# Patient Record
Sex: Female | Born: 1960 | Race: White | Hispanic: No | Smoking: Never smoker
Health system: Southern US, Community
[De-identification: ages and names within clinical notes are randomized; demographics above are authoritative.]

## PROBLEM LIST (undated history)

## (undated) DIAGNOSIS — R002 Palpitations: Secondary | ICD-10-CM

## (undated) DIAGNOSIS — N926 Irregular menstruation, unspecified: Secondary | ICD-10-CM

## (undated) DIAGNOSIS — I10 Essential (primary) hypertension: Secondary | ICD-10-CM

## (undated) DIAGNOSIS — E669 Obesity, unspecified: Secondary | ICD-10-CM

## (undated) HISTORY — DX: Irregular menstruation, unspecified: N92.6

## (undated) HISTORY — DX: Obesity, unspecified: E66.9

## (undated) HISTORY — PX: TUMOR REMOVAL: SHX12

## (undated) HISTORY — DX: Essential (primary) hypertension: I10

## (undated) HISTORY — PX: TONSILLECTOMY: SHX5217

## (undated) HISTORY — DX: Palpitations: R00.2

## (undated) HISTORY — PX: OTHER SURGICAL HISTORY: SHX169

## (undated) HISTORY — PX: ECTOPIC PREGNANCY SURGERY: SHX613

---

## 2002-07-24 ENCOUNTER — Other Ambulatory Visit: Admission: RE | Admit: 2002-07-24 | Discharge: 2002-07-24 | Payer: Self-pay | Admitting: Obstetrics and Gynecology

## 2003-07-29 ENCOUNTER — Other Ambulatory Visit: Admission: RE | Admit: 2003-07-29 | Discharge: 2003-07-29 | Payer: Self-pay | Admitting: Obstetrics and Gynecology

## 2004-10-22 ENCOUNTER — Ambulatory Visit: Payer: Self-pay | Admitting: Internal Medicine

## 2004-11-03 ENCOUNTER — Ambulatory Visit: Payer: Self-pay | Admitting: Internal Medicine

## 2004-11-05 ENCOUNTER — Other Ambulatory Visit: Admission: RE | Admit: 2004-11-05 | Discharge: 2004-11-05 | Payer: Self-pay | Admitting: Obstetrics and Gynecology

## 2004-12-17 ENCOUNTER — Ambulatory Visit (HOSPITAL_COMMUNITY): Admission: RE | Admit: 2004-12-17 | Discharge: 2004-12-17 | Payer: Self-pay | Admitting: Obstetrics and Gynecology

## 2007-05-24 ENCOUNTER — Other Ambulatory Visit: Admission: RE | Admit: 2007-05-24 | Discharge: 2007-05-24 | Payer: Self-pay | Admitting: Obstetrics and Gynecology

## 2008-05-26 ENCOUNTER — Other Ambulatory Visit: Admission: RE | Admit: 2008-05-26 | Discharge: 2008-05-26 | Payer: Self-pay | Admitting: Obstetrics and Gynecology

## 2009-02-26 ENCOUNTER — Emergency Department (HOSPITAL_COMMUNITY): Admission: EM | Admit: 2009-02-26 | Discharge: 2009-02-26 | Payer: Self-pay | Admitting: Emergency Medicine

## 2009-06-16 ENCOUNTER — Ambulatory Visit (HOSPITAL_COMMUNITY): Admission: RE | Admit: 2009-06-16 | Discharge: 2009-06-16 | Payer: Self-pay | Admitting: Obstetrics and Gynecology

## 2009-06-16 LAB — HM MAMMOGRAPHY: HM Mammogram: NORMAL

## 2009-11-09 ENCOUNTER — Emergency Department (HOSPITAL_COMMUNITY): Admission: EM | Admit: 2009-11-09 | Discharge: 2009-11-10 | Payer: Self-pay | Admitting: Emergency Medicine

## 2010-05-10 ENCOUNTER — Other Ambulatory Visit: Payer: Self-pay | Admitting: Obstetrics and Gynecology

## 2010-05-10 DIAGNOSIS — Z1231 Encounter for screening mammogram for malignant neoplasm of breast: Secondary | ICD-10-CM

## 2010-06-21 ENCOUNTER — Ambulatory Visit (HOSPITAL_COMMUNITY): Payer: 59 | Attending: Obstetrics and Gynecology

## 2011-07-07 LAB — HM PAP SMEAR: HM Pap smear: NEGATIVE

## 2012-07-10 ENCOUNTER — Encounter: Payer: Self-pay | Admitting: *Deleted

## 2012-07-13 ENCOUNTER — Ambulatory Visit: Payer: Self-pay | Admitting: Nurse Practitioner

## 2012-07-20 ENCOUNTER — Ambulatory Visit: Payer: Self-pay | Admitting: Nurse Practitioner

## 2012-07-31 ENCOUNTER — Encounter: Payer: Self-pay | Admitting: *Deleted

## 2012-08-13 ENCOUNTER — Encounter: Payer: Self-pay | Admitting: Nurse Practitioner

## 2012-08-13 ENCOUNTER — Ambulatory Visit (INDEPENDENT_AMBULATORY_CARE_PROVIDER_SITE_OTHER): Payer: 59 | Admitting: Nurse Practitioner

## 2012-08-13 ENCOUNTER — Other Ambulatory Visit: Payer: Self-pay | Admitting: Nurse Practitioner

## 2012-08-13 VITALS — BP 112/66 | HR 52 | Ht 65.25 in | Wt 237.0 lb

## 2012-08-13 DIAGNOSIS — Z1231 Encounter for screening mammogram for malignant neoplasm of breast: Secondary | ICD-10-CM

## 2012-08-13 DIAGNOSIS — Z01419 Encounter for gynecological examination (general) (routine) without abnormal findings: Secondary | ICD-10-CM

## 2012-08-13 NOTE — Progress Notes (Signed)
Patient ID: Alexandra Newman, female   DOB: 09-Feb-1961, 52 y.o.   MRN: [id number] 52 y.o. [id number] Married Caucasian Fe here for annual exam.  Menses every 6 weeks toward  the end of last year.  Then in 2014 - had cycle in Feb. May, and June.  The one in May was heavier and longer lasting 3 days. She has moved her mother from assisted living in [place] to assisted living here in [place] the past few months. Very mild vaso symptoms if any.  Patient's last menstrual period was 07/31/2012.          Sexually active: no  The current method of family planning is none.    Exercising: yes  Gym/ health club routine includes personal trainer 3 times per week. Smoker:  no  Health Maintenance: Pap:  07/07/11 normal with negative HR HPV MMG:  06/16/09 Scheduled for follow-up screening MMG at [facility] for 08/23/12 in office today. Colonoscopy:  2006, repeat in 10 years BMD:   never TDaP:   current Labs: 04/2012 @ PCP   reports that she has never smoked. She has never used smokeless tobacco. She reports that she does not drink alcohol or use illicit drugs.  Past Medical History  Diagnosis Date  . Irregular menses   . Hypertension   . Irregular bleeding     Past Surgical History  Procedure Laterality Date  . Ectopic pregnancy surgery    . 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 Tonsillectomy  52 yr old    Current Outpatient Prescriptions  Medication Sig Dispense Refill  . acetaminophen (TYLENOL) 325 MG tablet Take 650 mg by mouth every 6 (six) hours as needed for pain.      Marland Kitchen aspirin 81 MG tablet Take 81 mg by mouth daily.      Marland Kitchen atenolol (TENORMIN) 25 MG tablet Take 25 mg by mouth daily.       No current facility-administered medications for this visit.    Family History  Problem Relation Age of Onset  . Heart failure Brother   . Heart disease Mother     CABG  . Osteoarthritis Mother   . Hearing loss Mother   . Stroke Father     ROS:  Pertinent items are noted in HPI.  Otherwise,  a comprehensive ROS was negative.  Exam:   BP 112/66  Pulse 52  Ht 5' 5.25" (1.657 m)  Wt 237 lb (107.502 kg)  BMI 39.15 kg/m2  LMP 07/31/2012 Height: 5' 5.25" (165.7 cm) Weight Loss of 42 lbs. Last year at [facility]. This year added back 10 lbs. Ht Readings from Last 3 Encounters:  08/13/12 5' 5.25" (1.657 m)    General appearance: alert, cooperative and appears stated age Head: Normocephalic, without obvious abnormality, atraumatic Neck: no adenopathy, supple, symmetrical, trachea midline and thyroid normal to inspection and palpation Lungs: clear to auscultation bilaterally Breasts: normal appearance, no masses or tenderness Heart: regular rate and rhythm Abdomen: soft, non-tender; no masses,  no organomegaly Extremities: extremities normal, atraumatic, no cyanosis or edema Skin: Skin color, texture, turgor normal. No rashes or lesions Lymph nodes: Cervical, supraclavicular, and axillary nodes normal. No abnormal inguinal nodes palpated Neurologic: Grossly normal   Pelvic: External genitalia:  no lesions              Urethra:  normal appearing urethra with no masses, tenderness or lesions              Bartholin's and  Skene's: normal                 Vagina: normal appearing vagina with normal color and discharge, no lesions              Cervix: anteverted              Pap taken: no Bimanual Exam:  Uterus:  normal size, contour, position, consistency, mobility, non-tender              Adnexa: no mass, fullness, tenderness               Rectovaginal: Confirms               Anus:  normal sphincter tone, no lesions  A:  Well Woman with normal exam  Perimenopausal consistent with irregular cycles  P:   Pap smear as per guidelines   Keep menses calendar- if goes over 90 days without a cycle to cal  back  Mammogram given information and patient to schedule today, we went ahead and  made patient an appointment on July 3 at 9:30 am messageg left for pt.  counseled on breast self exam,  adequate intake of calcium and vitamin D,   diet and exercise, Kegel's exercises return annually or prn  An After Visit Summary was printed and given to the patient.

## 2012-08-13 NOTE — Patient Instructions (Addendum)

## 2012-08-15 NOTE — Progress Notes (Signed)
Encounter reviewed by Dr. Marylee Belzer Silva.  

## 2012-08-23 ENCOUNTER — Ambulatory Visit (HOSPITAL_COMMUNITY): Payer: 59 | Attending: Nurse Practitioner

## 2012-09-19 ENCOUNTER — Ambulatory Visit (HOSPITAL_COMMUNITY): Payer: 59 | Attending: Nurse Practitioner

## 2012-12-25 ENCOUNTER — Ambulatory Visit (HOSPITAL_COMMUNITY)
Admission: RE | Admit: 2012-12-25 | Discharge: 2012-12-25 | Disposition: A | Discharge status: Home / Self Care | Payer: 59 | Source: Ambulatory Visit | Attending: Nurse Practitioner | Admitting: Nurse Practitioner

## 2012-12-25 DIAGNOSIS — Z1231 Encounter for screening mammogram for malignant neoplasm of breast: Secondary | ICD-10-CM | POA: Insufficient documentation

## 2012-12-27 ENCOUNTER — Other Ambulatory Visit: Payer: Self-pay | Admitting: Nurse Practitioner

## 2012-12-27 DIAGNOSIS — R928 Other abnormal and inconclusive findings on diagnostic imaging of breast: Secondary | ICD-10-CM

## 2013-01-16 ENCOUNTER — Ambulatory Visit
Admission: RE | Admit: 2013-01-16 | Discharge: 2013-01-16 | Disposition: A | Discharge status: Home / Self Care | Payer: 59 | Source: Ambulatory Visit | Attending: Nurse Practitioner | Admitting: Nurse Practitioner

## 2013-01-16 DIAGNOSIS — R928 Other abnormal and inconclusive findings on diagnostic imaging of breast: Secondary | ICD-10-CM

## 2013-02-04 ENCOUNTER — Encounter: Payer: Self-pay | Admitting: Internal Medicine

## 2013-05-28 ENCOUNTER — Encounter: Payer: Self-pay | Admitting: Nurse Practitioner

## 2013-06-06 ENCOUNTER — Encounter: Payer: Self-pay | Admitting: Physician Assistant

## 2013-06-11 ENCOUNTER — Encounter: Payer: Self-pay | Admitting: Physician Assistant

## 2013-06-11 ENCOUNTER — Ambulatory Visit (INDEPENDENT_AMBULATORY_CARE_PROVIDER_SITE_OTHER): Payer: 59 | Admitting: Physician Assistant

## 2013-06-11 VITALS — BP 120/76 | HR 53 | Ht 63.0 in | Wt 259.0 lb

## 2013-06-11 DIAGNOSIS — I10 Essential (primary) hypertension: Secondary | ICD-10-CM

## 2013-06-11 DIAGNOSIS — K921 Melena: Secondary | ICD-10-CM

## 2013-06-11 MED ORDER — MOVIPREP 100 G PO SOLR: 1.0000 | ORAL | Status: DC

## 2013-06-11 NOTE — Progress Notes (Signed)
   Subjective:    Patient ID: Alexandra Newman, female    DOB: 11/19/1960, 53 y.o.   MRN: [id number]  HPI  Alexandra Newman is a 53 year old white female known to Dr. Marina GoodellPerry from prior colonoscopy done in 2006 for complaints of hematochezia. This was a normal exam. Today she is referred back by her PCP Dr. Timothy Lassousso after having a positive Hemosure  at the time of recent physical.. Labs are reviewed and hemoglobin 13.9 hematocrit 41.9 MCV of 86.7 on 05/28/2013. Patient is asymptomatic. She specifically denies any heartburn indigestion dysphagia or odynophagia. She has no complaints of abdominal pain changes in bowel habits melena or hematochezia. She takes a baby aspirin once daily no regular NSAIDs. Family history is negative for colon cancer as far she is aware. She has a maternal grandfather who had some sort of "stomach cancer".    Review of Systems  Constitutional: Negative.   HENT: Negative.   Eyes: Negative.   Respiratory: Negative.   Cardiovascular: Negative.   Gastrointestinal: Negative.   Endocrine: Negative.   Genitourinary: Negative.   Musculoskeletal: Negative.   Allergic/Immunologic: Negative.   Neurological: Negative.   Hematological: Negative.   Psychiatric/Behavioral: Negative.    Outpatient Prescriptions Prior to Visit  Medication Sig Dispense Refill  . acetaminophen (TYLENOL) 325 MG tablet Take 650 mg by mouth every 6 (six) hours as needed for pain.      Marland Kitchen. aspirin 81 MG tablet Take 81 mg by mouth daily.      Marland Kitchen. atenolol (TENORMIN) 25 MG tablet Take 25 mg by mouth daily.       No facility-administered medications prior to visit.   No Known Allergies        History  Substance Use Topics  . Smoking status: Never Smoker   . Smokeless tobacco: Never Used  . Alcohol Use: No   family history includes Hearing loss in her mother; Heart disease in her mother; Heart failure in her brother; Osteoarthritis in her mother; Stroke in her father.  Objective:   Physical Exam  female in no  acute distress, pleasant. Blood pressure 120/76 pulse 53 height 5 foot 3 weight 259 BMI 45. HEENT; nontraumatic normocephalic EOMI PERRLA sclera anicteric, Supple; no JVD, Cardiovascular; regular rate and rhythm with S1-S2 no murmur or gallop, Pulmonary; clear bilaterally, Abdomen;soft ,nontender ,nondistended, bowel sounds are active there is no palpable mass or hepatosplenomegaly, Rectal; exam not done patient recently with documented Hemoccult positive Hemosure . Ext; no clubbing ,cyanosis, or edema skin warm and dry, Psych; mood and affect appropriate        Assessment & Plan:   #141 53 year old female with Hemoccult-positive stool, asymptomatic. Average risk for colon neoplasia and negative colonoscopy in 2006. Will rule out occult colon lesion #2 morbid obesity BMI 45 #3 hypertension  Plan; Schedule for colonoscopy with Dr. Yancey FlemingsJohn Perry. Procedure discussed in detail with patient and she is agreeable to proceed.

## 2013-06-11 NOTE — Patient Instructions (Signed)
We sent a prescription for the colonoscopy prep to [facility] [address]  You have been scheduled for a colonoscopy with propofol. Please follow written instructions given to you at your visit today.  Please pick up your prep kit at the pharmacy within the next 1-3 days. If you use inhalers (even only as needed), please bring them with you on the day of your procedure.

## 2013-06-11 NOTE — Progress Notes (Signed)
Agree with initial assessment and plans as outlined 

## 2013-06-20 ENCOUNTER — Encounter: Payer: Self-pay | Admitting: Internal Medicine

## 2013-07-18 ENCOUNTER — Encounter: Payer: Self-pay | Admitting: Internal Medicine

## 2013-07-18 ENCOUNTER — Ambulatory Visit (AMBULATORY_SURGERY_CENTER): Payer: 59 | Admitting: Internal Medicine

## 2013-07-18 VITALS — BP 112/59 | HR 50 | Temp 97.3°F | Resp 19 | Ht 63.0 in | Wt 259.0 lb

## 2013-07-18 DIAGNOSIS — K921 Melena: Secondary | ICD-10-CM

## 2013-07-18 MED ORDER — SODIUM CHLORIDE 0.9 % IV SOLN: 500.0000 mL | INTRAVENOUS | Status: DC

## 2013-07-18 NOTE — Progress Notes (Signed)
A/ox3 pleased with MAC, report to Jane RN 

## 2013-07-18 NOTE — Patient Instructions (Signed)
YOU HAD AN ENDOSCOPIC PROCEDURE TODAY AT THE [facility]: Refer to the procedure report that was given to you for any specific questions about what was found during the examination.  If the procedure report does not answer your questions, please call your gastroenterologist to clarify.  If you requested that your care partner not be given the details of your procedure findings, then the procedure report has been included in a sealed envelope for you to review at your convenience later.  YOU SHOULD EXPECT: Some feelings of bloating in the abdomen. Passage of more gas than usual.  Walking can help get rid of the air that was put into your GI tract during the procedure and reduce the bloating. If you had a lower endoscopy (such as a colonoscopy or flexible sigmoidoscopy) you may notice spotting of blood in your stool or on the toilet paper. If you underwent a bowel prep for your procedure, then you may not have a normal bowel movement for a few days.  DIET: Your first meal following the procedure should be a light meal and then it is ok to progress to your normal diet.  A half-sandwich or bowl of soup is an example of a good first meal.  Heavy or fried foods are harder to digest and may make you feel nauseous or bloated.  Likewise meals heavy in dairy and vegetables can cause extra gas to form and this can also increase the bloating.  Drink plenty of fluids but you should avoid alcoholic beverages for 24 hours.  ACTIVITY: Your care partner should take you home directly after the procedure.  You should plan to take it easy, moving slowly for the rest of the day.  You can resume normal activity the day after the procedure however you should NOT DRIVE or use heavy machinery for 24 hours (because of the sedation medicines used during the test).    SYMPTOMS TO REPORT IMMEDIATELY: A gastroenterologist can be reached at any hour.  During normal business hours, 8:30 AM to 5:00 PM Monday through Friday,  call [phone].  After hours and on weekends, please call the GI answering service at [phone] who will take a message and have the physician on call contact you.   Following lower endoscopy (colonoscopy or flexible sigmoidoscopy):  Excessive amounts of blood in the stool  Significant tenderness or worsening of abdominal pains  Swelling of the abdomen that is new, acute  Fever of 100F or higher   FOLLOW UP: If any biopsies were taken you will be contacted by phone or by letter within the next 1-3 weeks.  Call your gastroenterologist if you have not heard about the biopsies in 3 weeks.  Our staff will call the home number listed on your records the next business day following your procedure to check on you and address any questions or concerns that you may have at that time regarding the information given to you following your procedure. This is a courtesy call and so if there is no answer at the home number and we have not heard from you through the emergency physician on call, we will assume that you have returned to your regular daily activities without incident.  SIGNATURES/CONFIDENTIALITY: You and/or your care partner have signed paperwork which will be entered into your electronic medical record.  These signatures attest to the fact that that the information above on your After Visit Summary has been reviewed and is understood.  Full responsibility of the confidentiality of   this discharge information lies with you and/or your care-partner.  Normal colonoscopy.  Next colonoscopy 10 years-2025. 

## 2013-07-18 NOTE — Op Note (Signed)
[facility] [address] [place], [address]   COLONOSCOPY PROCEDURE REPORT  PATIENT: Alexandra Newman, Alexandra Newman  MR#: [id number] BIRTHDATE: 1960/04/19 , 53  yrs. old GENDER: Female ENDOSCOPIST: Roxy Cedar, MD REFERRED SK:AJGO Timothy Lasso, M.D. PROCEDURE DATE:  07/18/2013 PROCEDURE:   Colonoscopy, diagnostic First Screening Colonoscopy - Avg.  risk and is 50 yrs.  old or older - No.  Prior Negative Screening - Now for repeat screening. N/A  History of Adenoma - Now for follow-up colonoscopy & has been > or = to 3 yrs.  N/A  Polyps Removed Today? No.  Recommend repeat exam, <10 yrs? No. ASA CLASS:   Class II INDICATIONS:heme-positive stool.   Normal exam 10-2004 MEDICATIONS: MAC sedation, administered by CRNA and propofol (Diprivan) 280mg  IV  DESCRIPTION OF PROCEDURE:   After the risks benefits and alternatives of the procedure were thoroughly explained, informed consent was obtained.  A digital rectal exam revealed no abnormalities of the rectum.   The LB [id number]  endoscope was introduced through the anus and advanced to the cecum, which was identified by both the appendix and ileocecal valve. No adverse events experienced.   The quality of the prep was excellent, using MoviPrep  The instrument was then slowly withdrawn as the colon was fully examined.      COLON FINDINGS: A normal appearing cecum, ileocecal valve, and appendiceal orifice were identified.  The ascending, hepatic flexure, transverse, splenic flexure, descending, sigmoid colon and rectum appeared unremarkable.  No polyps or cancers were seen. Retroflexed views revealed no abnormalities. The time to cecum=4 minutes 32 seconds.  Withdrawal time=13 minutes 0 seconds.  The scope was withdrawn and the procedure completed. COMPLICATIONS: There were no complications.  ENDOSCOPIC IMPRESSION: Normal colon  RECOMMENDATIONS: Continue current colorectal screening recommendations for "routine risk"  patients with a repeat colonoscopy in 10 years.   eSigned:  Roxy Cedar, MD 07/18/2013 9:07 AM   cc: Creola Corn, MD and The Patient

## 2013-07-19 ENCOUNTER — Telehealth: Payer: Self-pay | Admitting: *Deleted

## 2013-07-19 NOTE — Telephone Encounter (Signed)
  Follow up Call-  Call back number 07/18/2013  Post procedure Call Back phone  # [phone]  Permission to leave phone message Yes     Patient questions:  Do you have a fever, pain , or abdominal swelling? no Pain Score  0 *  Have you tolerated food without any problems? yes  Have you been able to return to your normal activities? yes  Do you have any questions about your discharge instructions: Diet   no Medications  no Follow up visit  no  Do you have questions or concerns about your Care? no  Actions: * If pain score is 4 or above: No action needed, pain <4.

## 2013-08-15 ENCOUNTER — Ambulatory Visit: Payer: 59 | Admitting: Nurse Practitioner

## 2013-08-26 ENCOUNTER — Ambulatory Visit (INDEPENDENT_AMBULATORY_CARE_PROVIDER_SITE_OTHER): Payer: 59 | Admitting: Nurse Practitioner

## 2013-08-26 ENCOUNTER — Encounter: Payer: Self-pay | Admitting: Nurse Practitioner

## 2013-08-26 VITALS — BP 118/76 | HR 60 | Ht 65.5 in | Wt 253.0 lb

## 2013-08-26 DIAGNOSIS — N926 Irregular menstruation, unspecified: Secondary | ICD-10-CM

## 2013-08-26 DIAGNOSIS — I10 Essential (primary) hypertension: Secondary | ICD-10-CM

## 2013-08-26 DIAGNOSIS — Z01419 Encounter for gynecological examination (general) (routine) without abnormal findings: Secondary | ICD-10-CM

## 2013-08-26 NOTE — Patient Instructions (Addendum)

## 2013-08-26 NOTE — Progress Notes (Signed)
Patient ID: Alexandra Newman, female   DOB: 05/15/1960, 53 y.o.   MRN: [id number] 53 y.o. [id number] Married Caucasian Fe here for annual exam. No menses since December of last year.  Last year her cycles were irregular as well.  She may have had another cycle in November last year but uncertain.  Now this year with her mothers health declining and had to be in hospital and nursing home she has been under stress.  Mother passed on 04/02/13.  So far this year a menses in May and June that was short lasting for 1 -1/2 day. Felt like maybe going to have a cycle in the spring with lower pelvic cramps but no menses.  No vaso symptoms.   Patient's last menstrual period was 08/11/2013.          Sexually active: No.  The current method of family planning is abstinence.    Exercising: Yes.    Home exercise routine includes biking and walking. Smoker:  no  Health Maintenance: Pap:  07/07/11, WNL, neg HR HPV MMG:  01/16/13, Bi-Rads 1: negative  Colonoscopy:  06/2013, following pos hemoccult test, normal, repeat in 10 years TDaP:  Dr. Timothy Lassousso - [facility] Labs: 05/2013, normal at PCP   reports that she has never smoked. She has never used smokeless tobacco. She reports that she drinks about 1.8 ounces of alcohol per week. She reports that she does not use illicit drugs.  Past Medical History  Diagnosis Date  . Irregular menses   . Hypertension   . Irregular bleeding   . Obesity     Past Surgical History  Procedure Laterality Date  . Ectopic pregnancy surgery    . 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 Tonsillectomy  53 yr old  . Appendectomy      taken during above tumor removal    Current Outpatient Prescriptions  Medication Sig Dispense Refill  . acetaminophen (TYLENOL) 325 MG tablet Take 650 mg by mouth every 6 (six) hours as needed for pain.      Marland Kitchen. aspirin 81 MG tablet Take 81 mg by mouth daily.      Marland Kitchen. atenolol (TENORMIN) 25 MG tablet Take 25 mg by mouth daily.       No current facility-administered  medications for this visit.    Family History  Problem Relation Age of Onset  . Heart failure Brother   . Heart disease Mother     CABG  . Osteoarthritis Mother   . Hearing loss Mother   . Stroke Father     ROS:  Pertinent items are noted in HPI.  Otherwise, a comprehensive ROS was negative.  Exam:   BP 118/76  Pulse 60  Ht 5' 5.5" (1.664 m)  Wt 253 lb (114.76 kg)  BMI 41.45 kg/m2  LMP 08/11/2013 Height: 5' 5.5" (166.4 cm)  Ht Readings from Last 3 Encounters:  08/26/13 5' 5.5" (1.664 m)  07/18/13 5\' 3"  (1.6 m)  06/11/13 5\' 3"  (1.6 m)    General appearance: alert, cooperative and appears stated age Head: Normocephalic, without obvious abnormality, atraumatic Neck: no adenopathy, supple, symmetrical, trachea midline and thyroid normal to inspection and palpation Lungs: clear to auscultation bilaterally Breasts: normal appearance, no masses or tenderness Heart: regular rate and rhythm Abdomen: soft, non-tender; no masses,  no organomegaly Extremities: extremities normal, atraumatic, no cyanosis or edema Skin: Skin color, texture, turgor normal. No rashes or lesions Lymph nodes: Cervical, supraclavicular, and axillary nodes normal. No abnormal  inguinal nodes palpated Neurologic: Grossly normal   Pelvic: External genitalia:  no lesions              Urethra:  normal appearing urethra with no masses, tenderness or lesions              Bartholin's and Skene's: normal                 Vagina: normal appearing vagina with normal color and discharge, no lesions              Cervix: anteverted              Pap taken: No. Bimanual Exam:  Uterus:  normal size, contour, position, consistency, mobility, non-tender              Adnexa: no mass, fullness, tenderness               Rectovaginal: Confirms               Anus:  normal sphincter tone, no lesions  A:  Well Woman with normal exam  Perimenopausal consistent with irregular menses  HTN  Obesity   P:   Reviewed health and  wellness pertinent to exam  Pap smear not taken today  Mammogram is due 12/2013  Will call back if no menses in September for a Provera challenge  Counseled on breast self exam, mammography screening, adequate intake of calcium and vitamin D, diet and exercise, Kegel's exercises return annually or prn  An After Visit Summary was printed and given to the patient.

## 2013-08-28 NOTE — Progress Notes (Signed)
Encounter reviewed by Dr. Brook Silva.  

## 2013-12-23 ENCOUNTER — Encounter: Payer: Self-pay | Admitting: Nurse Practitioner

## 2014-05-16 ENCOUNTER — Other Ambulatory Visit (HOSPITAL_COMMUNITY): Payer: Self-pay | Admitting: Internal Medicine

## 2014-05-16 DIAGNOSIS — Z1231 Encounter for screening mammogram for malignant neoplasm of breast: Secondary | ICD-10-CM

## 2014-05-19 ENCOUNTER — Ambulatory Visit (HOSPITAL_COMMUNITY)
Admission: RE | Admit: 2014-05-19 | Discharge: 2014-05-19 | Disposition: A | Discharge status: Home / Self Care | Payer: BLUE CROSS/BLUE SHIELD | Source: Ambulatory Visit | Attending: Internal Medicine | Admitting: Internal Medicine

## 2014-05-19 DIAGNOSIS — Z1231 Encounter for screening mammogram for malignant neoplasm of breast: Secondary | ICD-10-CM | POA: Diagnosis not present

## 2014-09-01 ENCOUNTER — Ambulatory Visit (INDEPENDENT_AMBULATORY_CARE_PROVIDER_SITE_OTHER): Payer: BLUE CROSS/BLUE SHIELD | Admitting: Nurse Practitioner

## 2014-09-01 ENCOUNTER — Encounter: Payer: Self-pay | Admitting: Nurse Practitioner

## 2014-09-01 VITALS — BP 130/84 | HR 52 | Ht 65.75 in | Wt 252.0 lb

## 2014-09-01 DIAGNOSIS — Z0000 Encounter for general adult medical examination without abnormal findings: Secondary | ICD-10-CM

## 2014-09-01 DIAGNOSIS — Z01419 Encounter for gynecological examination (general) (routine) without abnormal findings: Secondary | ICD-10-CM

## 2014-09-01 MED ORDER — MEDROXYPROGESTERONE ACETATE 10 MG PO TABS: 10.0000 mg | ORAL_TABLET | Freq: Every day | ORAL | Status: DC

## 2014-09-01 NOTE — Progress Notes (Signed)
Patient ID: Alexandra Newman, female   DO: 1960-08-16, 54 y.o.   MRM: [id number] 54 y.o. J.P. Married  Caucasian Fe here for annual exam.  No new health problems.  Feels well.  Patient's last menstrual period was 08/21/2013 (approximate).          Sexually active: No.  The current method of family planning is abstinence.    Exercising: No.  The patient does not participate in regular exercise at present. Smoker:  no  Health Maintenance: Pap:  07/07/11, negative with neg HR HPV MMG:  05/19/14, Bi-Rads 1:  Negative Colonoscopy:  06/2013, following pos hemoccult test, normal, repeat in 10 years DAP:  [facility], Dr. Timothy Lasso Labs:  PCP   reports that she has never smoked. She has never used smokeless tobacco. She reports that she drinks about 1.8 oz of alcohol per week. She reports that she does not use illicit drugs.  Past Medical History  Diagnosis Date  . Irregular menses   . Hypertension   . Irregular bleeding   . Obesity     Past Surgical History  Procedure Laterality Date  . Ectopic pregnancy surgery    . 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 Tonsillectomy  54 yr old  . Appendectomy      taken during above tumor removal    Current Outpatient Prescriptions  Medication Sig Dispense Refill  . acetaminophen (TYLENOL) 325 MG tablet Take 650 mg by mouth every 6 (six) hours as needed for pain.    Marland Kitchen aspirin 81 MG tablet Take 81 mg by mouth daily.    Marland Kitchen atenolol (TENORMIN) 25 MG tablet Take 25 mg by mouth daily.    . medroxyPROGESTERone (PROVERA) 10 MG tablet Take 1 tablet (10 mg total) by mouth daily. 10 tablet 0   No current facility-administered medications for this visit.    Family History  Problem Relation Age of Onset  . Heart failure Brother   . Heart disease Mother     CABG  . Osteoarthritis Mother   . Hearing loss Mother   . Stroke Father     ROS:  Pertinent items are noted in HPI.  Otherwise, a comprehensive ROS was negative.  Exam:   BP 130/84 mmHg  Pulse 52  Ht 5'  5.75" (1.67 m)  Wt 252 lb (114.306 kg)  BMI 40.99 kg/m2  LMP 08/21/2013 (Approximate) Height: 5' 5.75" (167 cm) Ht Readings from Last 3 Encounters:  09/01/14 5' 5.75" (1.67 m)  08/26/13 5' 5.5" (1.664 m)  07/18/13  (1.6 m)    General appearance: alert, cooperative and appears stated age Head: Normocephalic, without obvious abnormality, atraumatic Neck: no adenopathy, supple, symmetrical, trachea midline and thyroid normal to inspection and palpation Lungs: clear to auscultation bilaterally Breasts: normal appearance, no masses or tenderness Heart: regular rate and rhythm Abdomen: soft, non-tender; no masses,  no organomegaly Extremities: extremities normal, atraumatic, no cyanosis or edema Skin: Skin color, texture, turgor normal. No rashes or lesions Lymph nodes: Cervical, supraclavicular, and axillary nodes normal. No abnormal inguinal nodes palpated Neurologic: Grossly normal   Pelvic: External genitalia:  no lesions              Urethra:  normal appearing urethra with no masses, tenderness or lesions              Bartholin's and Skene's: normal                 Vagina: normal appearing vagina with  normal color and discharge, no lesions              Cervix: anteverted              Pap taken: Yes.   Bimanual Exam:  Uterus:  normal size, contour, position, consistency, mobility, non-tender              Adnexa: no mass, fullness, tenderness               Rectovaginal: Confirms               Anus:  normal sphincter tone, no lesions  Chaperone present:  yes  A:  Well Woman with normal exam  Perimenopausal consistent with current amenorrhea   HTN  Obesity - BMI 40.98  CP:   Reviewed health and wellness pertinent to exam  Pap smear as above  Mammogram is due 04/2015  Will do a Provera challenge - at risk for endo hyperplasia secondary to BMI  She is aware to call back with results   Counseled on breast self exam, mammography screening, adequate intake of calcium and  vitamin D, diet and exercise return annually or prn  An After Visit Summary was printed and given to the patient.

## 2014-09-01 NOTE — Patient Instructions (Addendum)

## 2014-09-03 LAB — IPS PAP TEST WITH HPV

## 2014-09-04 NOTE — Progress Notes (Signed)
Encounter reviewed by Dr. Brook Amundson C. Silva.  

## 2015-05-05 ENCOUNTER — Other Ambulatory Visit: Payer: Self-pay

## 2015-05-05 DIAGNOSIS — Z1231 Encounter for screening mammogram for malignant neoplasm of breast: Secondary | ICD-10-CM

## 2015-05-27 ENCOUNTER — Ambulatory Visit
Admission: RE | Admit: 2015-05-27 | Discharge: 2015-05-27 | Disposition: A | Discharge status: Home / Self Care | Payer: Managed Care, Other (non HMO) | Source: Ambulatory Visit

## 2015-05-27 DIAGNOSIS — Z1231 Encounter for screening mammogram for malignant neoplasm of breast: Secondary | ICD-10-CM

## 2015-09-08 ENCOUNTER — Ambulatory Visit (INDEPENDENT_AMBULATORY_CARE_PROVIDER_SITE_OTHER): Payer: Managed Care, Other (non HMO) | Admitting: Nurse Practitioner

## 2015-09-08 ENCOUNTER — Encounter: Payer: Self-pay | Admitting: Nurse Practitioner

## 2015-09-08 VITALS — BP 110/62 | HR 60 | Ht 65.0 in | Wt 223.0 lb

## 2015-09-08 DIAGNOSIS — Z01419 Encounter for gynecological examination (general) (routine) without abnormal findings: Secondary | ICD-10-CM | POA: Diagnosis not present

## 2015-09-08 DIAGNOSIS — Z0000 Encounter for general adult medical examination without abnormal findings: Secondary | ICD-10-CM

## 2015-09-08 DIAGNOSIS — Z780 Asymptomatic menopausal state: Secondary | ICD-10-CM

## 2015-09-08 NOTE — Patient Instructions (Signed)

## 2015-09-08 NOTE — Progress Notes (Signed)
Patient ID: Alexandra Newman, female   DOB: 1960-10-27, 55 y.o.   MRN: [id number]  55 y.o. G33P0020 Married  Caucasian Fe here for annual exam.  No new medical problems. Very rare any vaso symptoms.   Provera challenge last July without any vaginal bleeding.   Intentional wt loss since April of 2017 of 30 lb with increase exercise and calorie reduction.    Her divorce is pending and to be final in October.  She is going next spring to [place], [place] and [place] with a girlfriend.  Patient's last menstrual period was 08/21/2013 (approximate).          Sexually active: No.  The current method of family planning is post menopausal status.    Exercising: Yes.    walking approx. one hour daily 6-7 days per week Smoker:  no  Health Maintenance: Pap:09/01/14, Negative with neg HR HPV, no history of abnormal pap MMG:05/27/15, Bi-Rads 1: Negative Colonoscopy: 07/18/13, following pos hemoccult test, normal, repeat in 10 years TDaP: TD, Dr. Timothy Lasso Hep C and HIV: done today Labs: Dr. Timothy Lasso takes care of all labs   reports that she has never smoked. She has never used smokeless tobacco. She reports that she drinks about 1.8 oz of alcohol per week. She reports that she does not use illicit drugs.  Past Medical History  Diagnosis Date  . Irregular menses   . Hypertension   . Irregular bleeding   . Obesity     Past Surgical History  Procedure Laterality Date  . Ectopic pregnancy surgery    . 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 Tonsillectomy  55 yr old  . Appendectomy      taken during above tumor removal    Current Outpatient Prescriptions  Medication Sig Dispense Refill  . acetaminophen (TYLENOL) 325 MG tablet Take 650 mg by mouth every 6 (six) hours as needed for pain.    Marland Kitchen aspirin 81 MG tablet Take 81 mg by mouth daily.    Marland Kitchen atenolol (TENORMIN) 25 MG tablet Take 25 mg by mouth daily.     No current facility-administered medications for this visit.    Family History  Problem  Relation Age of Onset  . Heart failure Brother   . Heart disease Mother     CABG  . Osteoarthritis Mother   . Hearing loss Mother   . Stroke Father     ROS:  Pertinent items are noted in HPI.  Otherwise, a comprehensive ROS was negative.  Exam:   BP 110/62 mmHg  Pulse 60  Ht  (1.651 m)  Wt 223 lb (101.152 kg)  BMI 37.11 kg/m2  LMP 08/21/2013 (Approximate) Height:  (165.1 cm) Ht Readings from Last 3 Encounters:  09/08/15  (1.651 m)  09/01/14 5' 5.75" (1.67 m)  08/26/13 5' 5.5" (1.664 m)    General appearance: alert, cooperative and appears stated age Head: Normocephalic, without obvious abnormality, atraumatic Neck: no adenopathy, supple, symmetrical, trachea midline and thyroid normal to inspection and palpation Lungs: clear to auscultation bilaterally Breasts: normal appearance, no masses or tenderness Heart: regular rate and rhythm Abdomen: soft, non-tender; no masses,  no organomegaly Extremities: extremities normal, atraumatic, no cyanosis or edema Skin: Skin color, texture, turgor normal. No rashes or lesions Lymph nodes: Cervical, supraclavicular, and axillary nodes normal. No abnormal inguinal nodes palpated Neurologic: Grossly normal   Pelvic: External genitalia:  no lesions, use a smaller speculum  Urethra:  normal appearing urethra with no masses, tenderness or lesions              Bartholin's and Skene's: normal                 Vagina: normal appearing vagina with normal color and discharge, no lesions              Cervix: anteverted              Pap taken: No. Bimanual Exam:  Uterus:  normal size, contour, position, consistency, mobility, non-tender              Adnexa: no mass, fullness, tenderness               Rectovaginal: Confirms               Anus:  normal sphincter tone, no lesions  Chaperone present: no  A:  Well Woman with normal exam  Postmenopausal consistent with current amenorrhea   HTN Obesity - down to BMI 37.11   P:   Reviewed health and wellness pertinent to exam  Pap smear not due, no history of abnormal  Mammogram is due 05/2016  Will follow with labs, check FSH  Counseled on breast self exam, mammography screening, adequate intake of calcium and vitamin D, diet and exercise return annually or prn  An After Visit Summary was printed and given to the patient.

## 2015-09-08 NOTE — Progress Notes (Signed)
Reviewed personally.  M. Suzanne Rydge Tex[place], MD.  

## 2015-09-09 LAB — VITAMIN D 25 HYDROXY (VIT D DEFICIENCY, FRACTURES): VIT D 25 HYDROXY: 33 ng/mL (ref 30–100)

## 2015-09-09 LAB — HIV ANTIBODY (ROUTINE TESTING W REFLEX): HIV: NONREACTIVE

## 2015-09-09 LAB — HEPATITIS C ANTIBODY: HCV AB: NEGATIVE

## 2015-09-09 LAB — FOLLICLE STIMULATING HORMONE: FSH: 103.3 m[IU]/mL

## 2016-07-12 ENCOUNTER — Other Ambulatory Visit: Payer: Self-pay | Admitting: Internal Medicine

## 2016-07-12 DIAGNOSIS — Z1231 Encounter for screening mammogram for malignant neoplasm of breast: Secondary | ICD-10-CM

## 2016-07-29 ENCOUNTER — Ambulatory Visit
Admission: RE | Admit: 2016-07-29 | Discharge: 2016-07-29 | Disposition: A | Discharge status: Home / Self Care | Payer: Managed Care, Other (non HMO) | Source: Ambulatory Visit | Attending: Internal Medicine | Admitting: Internal Medicine

## 2016-07-29 DIAGNOSIS — Z1231 Encounter for screening mammogram for malignant neoplasm of breast: Secondary | ICD-10-CM

## 2016-09-09 ENCOUNTER — Ambulatory Visit: Payer: Managed Care, Other (non HMO) | Admitting: Nurse Practitioner

## 2016-09-27 ENCOUNTER — Ambulatory Visit: Payer: Managed Care, Other (non HMO) | Admitting: Nurse Practitioner

## 2016-10-03 ENCOUNTER — Ambulatory Visit (INDEPENDENT_AMBULATORY_CARE_PROVIDER_SITE_OTHER): Payer: Managed Care, Other (non HMO) | Admitting: Obstetrics and Gynecology

## 2016-10-03 ENCOUNTER — Encounter: Payer: Self-pay | Admitting: Obstetrics and Gynecology

## 2016-10-03 VITALS — BP 102/62 | HR 60 | Resp 14 | Ht 65.25 in | Wt 145.0 lb

## 2016-10-03 DIAGNOSIS — Z01419 Encounter for gynecological examination (general) (routine) without abnormal findings: Secondary | ICD-10-CM

## 2016-10-03 DIAGNOSIS — N952 Postmenopausal atrophic vaginitis: Secondary | ICD-10-CM

## 2016-10-03 NOTE — Patient Instructions (Signed)

## 2016-10-03 NOTE — Progress Notes (Signed)
56 y.o. G2P0020 Divorced CaucasianF here for annual exam.  She has lost about 150 lbs, since April, 2017 she has lost 110 lbs. Got divorced last year, doing well.  No vaginal bleeding. Not sexually active.     Patient's last menstrual period was 08/21/2013 (approximate).          Sexually active: No.  The current method of family planning is post menopausal status.    Exercising: Yes.    walking, personal trainer Smoker:  no  Health Maintenance: Pap:  09/01/14, Negative with neg HR HPV History of abnormal Pap:  no MMG:  07/29/16 BIRADS 1 negative/density b Colonoscopy:  07/18/13, following pos hemoccult test, normal, repeat in 10 years BMD:  Done at Dr. Jonny RuizJohn Russo's office -- normal per patient TDaP:  Up to date with PCP per patient Gardasil: N/A   reports that she has never smoked. She has never used smokeless tobacco. She reports that she drinks about 1.8 oz of alcohol per week . She reports that she does not use drugs. She retired recently. She worked in [employer] for 30 years.   Past Medical History:  Diagnosis Date  . Hypertension   . Irregular bleeding   . Irregular menses   . Obesity     Past Surgical History:  Procedure Laterality Date  . appendectomy     taken during above tumor removal  . ECTOPIC PREGNANCY SURGERY    . TONSILLECTOMY  56 yr old  . TUMOR REMOVAL Right    from right ovary    Current Outpatient Prescriptions  Medication Sig Dispense Refill  . acetaminophen (TYLENOL) 325 MG tablet Take 650 mg by mouth every 6 (six) hours as needed for pain.    Marland Kitchen. aspirin 81 MG tablet Take 81 mg by mouth daily.    Marland Kitchen. atenolol (TENORMIN) 25 MG tablet Take 25 mg by mouth daily.     No current facility-administered medications for this visit.   She is on the atenolol for palpitations. She tried coming off of the medication and the palpitations returned. Probably doesn't need it for the HTN.   Family History  Problem Relation Age of Onset  . Heart failure Brother    . Heart disease Mother        CABG  . Osteoarthritis Mother   . Hearing loss Mother   . Stroke Father   Brother died at 3349, untreated HTN. Probably died of sudden MI.   Review of Systems  Constitutional: Negative.   HENT: Negative.   Eyes: Negative.   Respiratory: Negative.   Cardiovascular: Negative.   Gastrointestinal: Negative.   Endocrine: Negative.   Genitourinary: Negative.   Musculoskeletal: Negative.   Skin: Negative.   Allergic/Immunologic: Negative.   Neurological: Negative.   Hematological: Negative.   Psychiatric/Behavioral: Negative.     Exam:   BP 102/62 (BP Location: Right Arm, Patient Position: Sitting, Cuff Size: Normal)   Pulse 60   Resp 14   Ht 5' 5.25" (1.657 m)   Wt 145 lb (65.8 kg)   LMP 08/21/2013 (Approximate)   BMI 23.94 kg/m   Weight change: @WEIGHTCHANGE @ Height:   Height: 5' 5.25" (165.7 cm)  Ht Readings from Last 3 Encounters:  10/03/16 5' 5.25" (1.657 m)  09/08/15 5\' 5"  (1.651 m)  09/01/14 5' 5.75" (1.67 m)    General appearance: alert, cooperative and appears stated age Head: Normocephalic, without obvious abnormality, atraumatic Neck: no adenopathy, supple, symmetrical, trachea midline and thyroid normal to inspection and palpation Lungs:  clear to auscultation bilaterally Cardiovascular: regular rate and rhythm Breasts: normal appearance, no masses or tenderness Abdomen: soft, non-tender; bowel sounds normal; no masses,  no organomegaly Extremities: extremities normal, atraumatic, no cyanosis or edema Skin: Skin color, texture, turgor normal. No rashes or lesions. She has extra skin on her abdomen and legs from all of the weight loss. She has hair on her breasts bilaterally, she states it has always been like that no change.  Lymph nodes: Cervical, supraclavicular, and axillary nodes normal. No abnormal inguinal nodes palpated Neurologic: Grossly normal   Pelvic: External genitalia:  no lesions              Urethra:  normal  appearing urethra with no masses, tenderness or lesions              Bartholins and Skenes: normal                 Vagina: atrophic appearing vagina, needed a pediatric speculum and insertion of one finger vaginally was tight. Normal color, no discharge, no lesions              Cervix: no lesions               Bimanual Exam:  Uterus:  normal size, contour, position, consistency, mobility, non-tender              Adnexa: no mass, fullness, tenderness               Rectovaginal: Confirms               Anus:  normal sphincter tone, no lesions  Chaperone was present for exam.  A:  Well Woman with normal exam  Vaginal atrophy, not currently sexually active. I discussed with her she may need vaginal estrogen if she is going to be sexually active  P:   Pap next year  Mammogram and colonoscopy are UTD  She is on calcium and vit d supplement  Discussed breast self awareness  Labs with primary MD

## 2016-11-08 ENCOUNTER — Telehealth: Payer: Self-pay | Admitting: Obstetrics and Gynecology

## 2016-11-08 MED ORDER — ESTRADIOL 10 MCG VA TABS: 1.0000 | ORAL_TABLET | VAGINAL | 1 refills | Status: DC

## 2016-11-08 NOTE — Telephone Encounter (Signed)
Please let the patient know that I called in a one month supply with one refill. I would recommend she come in for f/u in one month. Please inform and set up an appointment.

## 2016-11-08 NOTE — Telephone Encounter (Signed)
Patient is interested in starting an estrogen medication. Patient said that she talked with Dr.Jertson about this at her last visit.

## 2016-11-08 NOTE — Telephone Encounter (Signed)
Spoke with patient. Patient was seen on 10/03/2016 for aex with Dr.Jertson. Reports she discussed use of vaginal estrogen for dryness and sexual activity. Would like to start on this medication at this time.  Routing to Dr.Jertson for review and advise of vaginal estrogen.

## 2016-11-09 NOTE — Telephone Encounter (Signed)
Spoke with patient. Advised of message as seen below from Dr.Jertson. Patient verbalizes understanding. 1 month follow up appointment scheduled for 12/08/2016 at 4 pm with Dr.Jertson. Patient is agreeable to date and time.  Routing to provider for final review. Patient agreeable to disposition. Will close encounter.

## 2016-12-08 ENCOUNTER — Encounter: Payer: Self-pay | Admitting: Obstetrics and Gynecology

## 2016-12-08 ENCOUNTER — Ambulatory Visit (INDEPENDENT_AMBULATORY_CARE_PROVIDER_SITE_OTHER): Payer: Managed Care, Other (non HMO) | Admitting: Obstetrics and Gynecology

## 2016-12-08 VITALS — BP 110/62 | HR 56 | Resp 14 | Ht 65.25 in | Wt 143.0 lb

## 2016-12-08 DIAGNOSIS — N952 Postmenopausal atrophic vaginitis: Secondary | ICD-10-CM | POA: Diagnosis not present

## 2016-12-08 MED ORDER — ESTRADIOL 10 MCG VA TABS: 1.0000 | ORAL_TABLET | VAGINAL | 3 refills | Status: DC

## 2016-12-08 NOTE — Progress Notes (Signed)
GYNECOLOGY  VISIT  CC:   Follow up vaginal estrogen  HPI: 56 y.o. G69P0020 Divorced Caucasian female here for follow up vaginal estrogen. The patient was noted to have a very atrophic vagina at her annual exam, difficult insertion on one finger. She decided to go on the vaginal estrogen for dryness and for the ability to be sexually active. Finished it last week. She dose feel less dry. She doesn't currently have a partner, but would like to.   GYNECOLOGIC HISTORY: Patient's last menstrual period was 08/21/2013 (approximate). Contraception:  Post menopausal  Menopausal hormone therapy: none  Patient Active Problem List   Diagnosis Date Noted  . Severe obesity (BMI >= 40) (HCC) 06/11/2013  . HTN (hypertension) 06/11/2013    Past Medical History:  Diagnosis Date  . Hypertension   . Irregular bleeding   . Irregular menses   . Obesity   . Palpitations     Past Surgical History:  Procedure Laterality Date  . appendectomy     taken during above tumor removal  . ECTOPIC PREGNANCY SURGERY    . TONSILLECTOMY  56 yr old  . TUMOR REMOVAL Right    from right ovary    MEDS:   Current Outpatient Prescriptions on File Prior to Visit  Medication Sig Dispense Refill  . acetaminophen (TYLENOL) 325 MG tablet Take 650 mg by mouth every 6 (six) hours as needed for pain.    Marland Kitchen aspirin 81 MG tablet Take 81 mg by mouth daily.    Marland Kitchen atenolol (TENORMIN) 25 MG tablet Take 25 mg by mouth daily.     No current facility-administered medications on file prior to visit.     ALLERGIES: Patient has no known allergies.  Family History  Problem Relation Age of Onset  . Heart failure Brother   . Heart disease Mother        CABG  . Osteoarthritis Mother   . Hearing loss Mother   . Stroke Father       Review of Systems  Constitutional: Negative.   HENT: Negative.   Eyes: Negative.   Respiratory: Negative.   Cardiovascular: Negative.   Gastrointestinal: Negative.   Genitourinary: Negative.    Musculoskeletal: Negative.   Skin: Negative.   Neurological: Negative.   Endo/Heme/Allergies: Negative.   Psychiatric/Behavioral: Negative.     PHYSICAL EXAMINATION:    BP 110/62 (BP Location: Right Arm, Patient Position: Sitting, Cuff Size: Normal)   Pulse (!) 56   Resp 14   Ht 5' 5.25" (1.657 m)   Wt 143 lb (64.9 kg)   LMP 08/21/2013 (Approximate)   BMI 23.61 kg/m     General appearance: alert, cooperative and appears stated age  Pelvic: External genitalia:  no lesions              Urethra:  normal appearing urethra with no masses, tenderness or lesions              Bartholins and Skenes: normal                 Vagina: normal appearing vagina with mild atrophy (improvement). Normal color and discharge, no lesions  Able to insert one finger and part of a second finger, marked improvement              Cervix: no lesions               Chaperone was present for exam.  Assessment: Vaginal atrophy, improving with vaginal estrogen  Plan: Continue estrogen 2  x a week Call with concerns F/U in 8/19 for an annual exam

## 2017-06-15 ENCOUNTER — Other Ambulatory Visit: Payer: Self-pay | Admitting: Internal Medicine

## 2017-06-15 DIAGNOSIS — Z1231 Encounter for screening mammogram for malignant neoplasm of breast: Secondary | ICD-10-CM

## 2017-07-31 ENCOUNTER — Ambulatory Visit
Admission: RE | Admit: 2017-07-31 | Discharge: 2017-07-31 | Disposition: A | Discharge status: Home / Self Care | Payer: Managed Care, Other (non HMO) | Source: Ambulatory Visit | Attending: Internal Medicine | Admitting: Internal Medicine

## 2017-07-31 DIAGNOSIS — Z1231 Encounter for screening mammogram for malignant neoplasm of breast: Secondary | ICD-10-CM

## 2017-10-05 NOTE — Progress Notes (Deleted)
57 y.o. [id number] DivorcedCaucasianF here for annual exam.      Patient's last menstrual period was 08/21/2013 (approximate).          Sexually active: {yes no:314532}  The current method of family planning is post menopausal status.    Exercising: {yes no:314532}  {types:19826} Smoker:  {YES [id number]:22349}  Health Maintenance: Pap:  09/01/2014 normal History of abnormal Pap:  no MMG:  07/31/2017 BI-RADS CATEGORY  1: Negative Colonoscopy:  07/18/2017 normal repeat in 10 years BMD:   n/a TDaP:  *** Gardasil: ***   reports that she has never smoked. She has never used smokeless tobacco. She reports that she drinks about 3.0 standard drinks of alcohol per week. She reports that she does not use drugs.  Past Medical History:  Diagnosis Date  . Hypertension   . Irregular bleeding   . Irregular menses   . Obesity   . Palpitations     Past Surgical History:  Procedure Laterality Date  . appendectomy     taken during above tumor removal  . ECTOPIC PREGNANCY SURGERY    . TONSILLECTOMY  57 yr old  . TUMOR REMOVAL Right    from right ovary    Current Outpatient Medications  Medication Sig Dispense Refill  . acetaminophen (TYLENOL) 325 MG tablet Take 650 mg by mouth every 6 (six) hours as needed for pain.    Marland Kitchen. aspirin 81 MG tablet Take 81 mg by mouth daily.    Marland Kitchen. atenolol (TENORMIN) 25 MG tablet Take 25 mg by mouth daily.    . Estradiol 10 MCG TABS vaginal tablet Place 1 tablet (10 mcg total) vaginally 2 (two) times a week. 24 tablet 3   No current facility-administered medications for this visit.     Family History  Problem Relation Age of Onset  . Heart failure Brother   . Heart disease Mother        CABG  . Osteoarthritis Mother   . Hearing loss Mother   . Stroke Father     Review of Systems  Constitutional: Negative.   HENT: Negative.   Eyes: Negative.   Respiratory: Negative.   Cardiovascular: Negative.   Gastrointestinal: Negative.   Endocrine: Negative.    Genitourinary: Negative.   Musculoskeletal: Negative.   Skin: Negative.   Allergic/Immunologic: Negative.   Neurological: Negative.   Hematological: Negative.   Psychiatric/Behavioral: Negative.   All other systems reviewed and are negative.   Exam:   LMP 08/21/2013 (Approximate)   Weight change: @WEIGHTCHANGE @ Height:      Ht Readings from Last 3 Encounters:  12/08/16 5' 5.25" (1.657 m)  10/03/16 5' 5.25" (1.657 m)  09/08/15 5\' 5"  (1.651 m)    General appearance: alert, cooperative and appears stated age Head: Normocephalic, without obvious abnormality, atraumatic Neck: no adenopathy, supple, symmetrical, trachea midline and thyroid {CHL AMB PHY EX THYROID NORM DEFAULT:[phone]::"normal to inspection and palpation"} Lungs: clear to auscultation bilaterally Cardiovascular: regular rate and rhythm Breasts: {Exam; breast:13139::"normal appearance, no masses or tenderness"} Abdomen: soft, non-tender; non distended,  no masses,  no organomegaly Extremities: extremities normal, atraumatic, no cyanosis or edema Skin: Skin color, texture, turgor normal. No rashes or lesions Lymph nodes: Cervical, supraclavicular, and axillary nodes normal. No abnormal inguinal nodes palpated Neurologic: Grossly normal   Pelvic: External genitalia:  no lesions              Urethra:  normal appearing urethra with no masses, tenderness or lesions  Bartholins and Skenes: normal                 Vagina: normal appearing vagina with normal color and discharge, no lesions              Cervix: {CHL AMB PHY EX CERVIX NORM [phone]::"no lesions"}               Bimanual Exam:  Uterus:  {CHL AMB PHY EX UTERUS NORM DEFAULT:[phone]::"normal size, contour, position, consistency, mobility, non-tender"}              Adnexa: {CHL AMB PHY EX ADNEXA NO MASS DEFAULT:[phone]::"no mass, fullness, tenderness"}               Rectovaginal: Confirms               Anus:  normal sphincter tone, no  lesions  Chaperone was present for exam.  A:  Well Woman with normal exam  P:

## 2017-10-11 ENCOUNTER — Ambulatory Visit: Payer: Managed Care, Other (non HMO) | Admitting: Obstetrics and Gynecology

## 2017-10-11 ENCOUNTER — Encounter: Payer: Self-pay | Admitting: Obstetrics and Gynecology

## 2017-10-28 DIAGNOSIS — Z23 Encounter for immunization: Secondary | ICD-10-CM | POA: Diagnosis not present

## 2017-11-30 DIAGNOSIS — I10 Essential (primary) hypertension: Secondary | ICD-10-CM | POA: Diagnosis not present

## 2017-11-30 DIAGNOSIS — Z6828 Body mass index (BMI) 28.0-28.9, adult: Secondary | ICD-10-CM | POA: Diagnosis not present

## 2017-11-30 DIAGNOSIS — M25562 Pain in left knee: Secondary | ICD-10-CM | POA: Diagnosis not present

## 2017-12-04 DIAGNOSIS — M25562 Pain in left knee: Secondary | ICD-10-CM | POA: Diagnosis not present

## 2018-02-20 DIAGNOSIS — L739 Follicular disorder, unspecified: Secondary | ICD-10-CM | POA: Diagnosis not present

## 2018-02-20 DIAGNOSIS — Z6829 Body mass index (BMI) 29.0-29.9, adult: Secondary | ICD-10-CM | POA: Diagnosis not present

## 2018-02-20 DIAGNOSIS — B999 Unspecified infectious disease: Secondary | ICD-10-CM | POA: Diagnosis not present

## 2018-07-27 DIAGNOSIS — R739 Hyperglycemia, unspecified: Secondary | ICD-10-CM | POA: Diagnosis not present

## 2018-07-27 DIAGNOSIS — R82998 Other abnormal findings in urine: Secondary | ICD-10-CM | POA: Diagnosis not present

## 2018-07-27 DIAGNOSIS — I10 Essential (primary) hypertension: Secondary | ICD-10-CM | POA: Diagnosis not present

## 2018-07-27 DIAGNOSIS — Z0000 Encounter for general adult medical examination without abnormal findings: Secondary | ICD-10-CM | POA: Diagnosis not present

## 2018-08-03 DIAGNOSIS — L988 Other specified disorders of the skin and subcutaneous tissue: Secondary | ICD-10-CM | POA: Diagnosis not present

## 2018-08-03 DIAGNOSIS — Z1331 Encounter for screening for depression: Secondary | ICD-10-CM | POA: Diagnosis not present

## 2018-08-03 DIAGNOSIS — Z0000 Encounter for general adult medical examination without abnormal findings: Secondary | ICD-10-CM | POA: Diagnosis not present

## 2018-08-03 DIAGNOSIS — H6980 Other specified disorders of Eustachian tube, unspecified ear: Secondary | ICD-10-CM | POA: Diagnosis not present

## 2018-08-03 DIAGNOSIS — M25562 Pain in left knee: Secondary | ICD-10-CM | POA: Diagnosis not present

## 2018-09-14 ENCOUNTER — Other Ambulatory Visit: Payer: Self-pay | Admitting: Internal Medicine

## 2018-09-14 DIAGNOSIS — Z1231 Encounter for screening mammogram for malignant neoplasm of breast: Secondary | ICD-10-CM

## 2018-11-01 ENCOUNTER — Ambulatory Visit
Admission: RE | Admit: 2018-11-01 | Discharge: 2018-11-01 | Disposition: A | Discharge status: Home / Self Care | Payer: BC Managed Care – PPO | Source: Ambulatory Visit | Attending: Internal Medicine | Admitting: Internal Medicine

## 2018-11-01 ENCOUNTER — Other Ambulatory Visit: Payer: Self-pay

## 2018-11-01 DIAGNOSIS — Z1231 Encounter for screening mammogram for malignant neoplasm of breast: Secondary | ICD-10-CM

## 2018-11-03 DIAGNOSIS — Z23 Encounter for immunization: Secondary | ICD-10-CM | POA: Diagnosis not present

## 2019-07-29 DIAGNOSIS — Z0000 Encounter for general adult medical examination without abnormal findings: Secondary | ICD-10-CM | POA: Diagnosis not present

## 2019-07-29 DIAGNOSIS — R739 Hyperglycemia, unspecified: Secondary | ICD-10-CM | POA: Diagnosis not present

## 2019-07-29 DIAGNOSIS — I10 Essential (primary) hypertension: Secondary | ICD-10-CM | POA: Diagnosis not present

## 2019-07-29 DIAGNOSIS — D649 Anemia, unspecified: Secondary | ICD-10-CM | POA: Diagnosis not present

## 2019-08-05 DIAGNOSIS — Z1331 Encounter for screening for depression: Secondary | ICD-10-CM | POA: Diagnosis not present

## 2019-08-05 DIAGNOSIS — H6980 Other specified disorders of Eustachian tube, unspecified ear: Secondary | ICD-10-CM | POA: Diagnosis not present

## 2019-08-05 DIAGNOSIS — D72819 Decreased white blood cell count, unspecified: Secondary | ICD-10-CM | POA: Diagnosis not present

## 2019-08-05 DIAGNOSIS — R251 Tremor, unspecified: Secondary | ICD-10-CM | POA: Diagnosis not present

## 2019-08-05 DIAGNOSIS — Z0000 Encounter for general adult medical examination without abnormal findings: Secondary | ICD-10-CM | POA: Diagnosis not present

## 2019-08-05 DIAGNOSIS — D649 Anemia, unspecified: Secondary | ICD-10-CM | POA: Diagnosis not present

## 2019-09-24 DIAGNOSIS — G252 Other specified forms of tremor: Secondary | ICD-10-CM | POA: Diagnosis not present

## 2019-10-15 DIAGNOSIS — G252 Other specified forms of tremor: Secondary | ICD-10-CM | POA: Diagnosis not present

## 2019-10-19 DIAGNOSIS — Z20822 Contact with and (suspected) exposure to covid-19: Secondary | ICD-10-CM | POA: Diagnosis not present

## 2019-10-19 DIAGNOSIS — Z03818 Encounter for observation for suspected exposure to other biological agents ruled out: Secondary | ICD-10-CM | POA: Diagnosis not present

## 2019-11-23 DIAGNOSIS — Z23 Encounter for immunization: Secondary | ICD-10-CM | POA: Diagnosis not present

## 2019-12-02 DIAGNOSIS — G20 Parkinson's disease: Secondary | ICD-10-CM | POA: Diagnosis not present

## 2020-03-01 IMAGING — MG MM DIGITAL SCREENING BILAT W/ TOMO W/ CAD
8 series · 8 of 24 positions shown · non-contrast
Comparison: Previous exam(s).

CLINICAL DATA: Screening.

EXAM:
DIGITAL SCREENING BILATERAL MAMMOGRAM WITH TOMO AND CAD

[L MLO synth-2D]
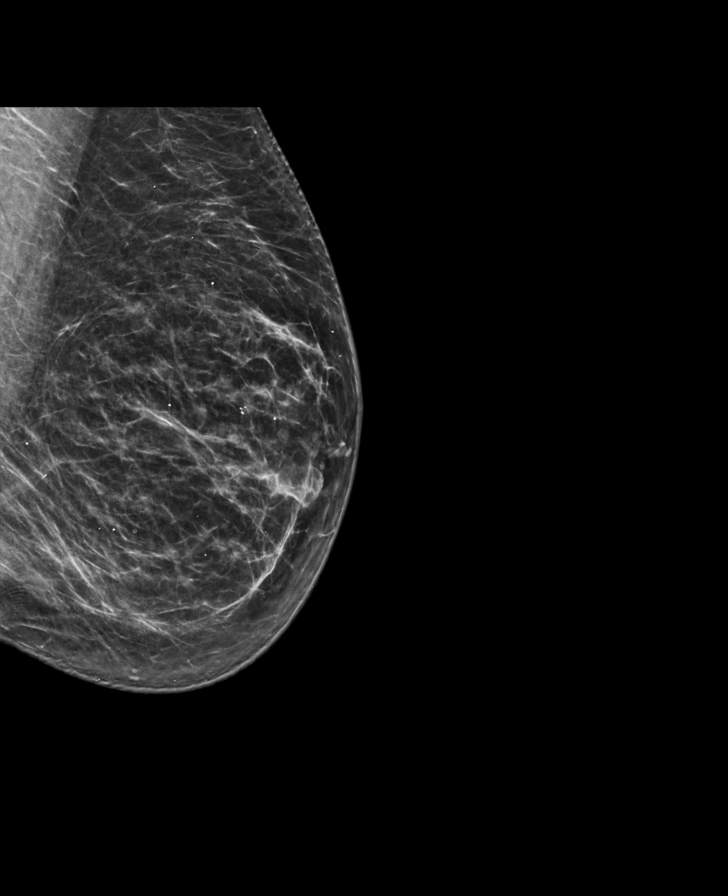

[L CC synth-2D]
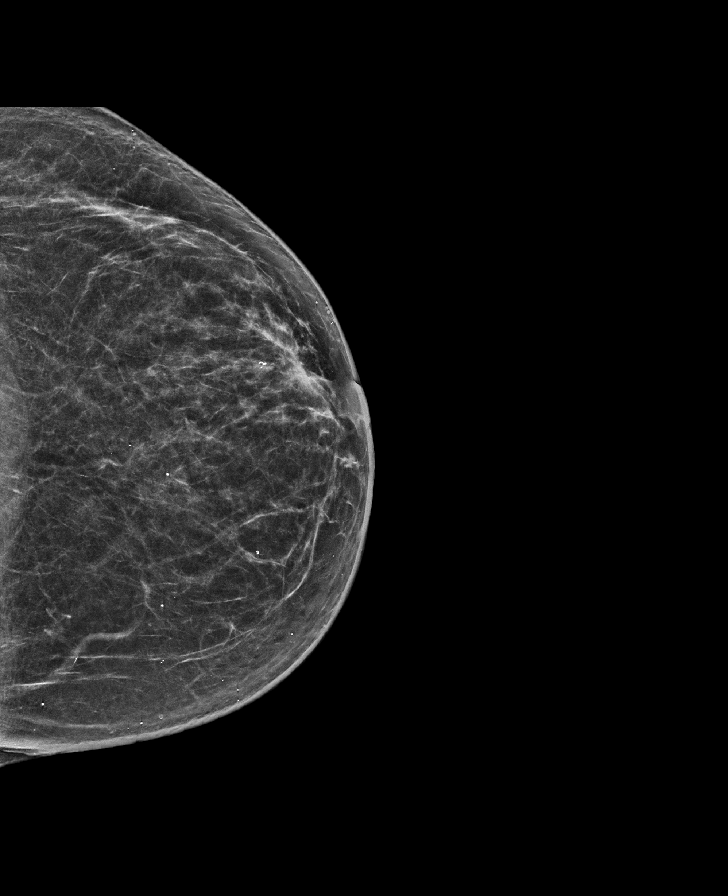

[R MLO synth-2D]
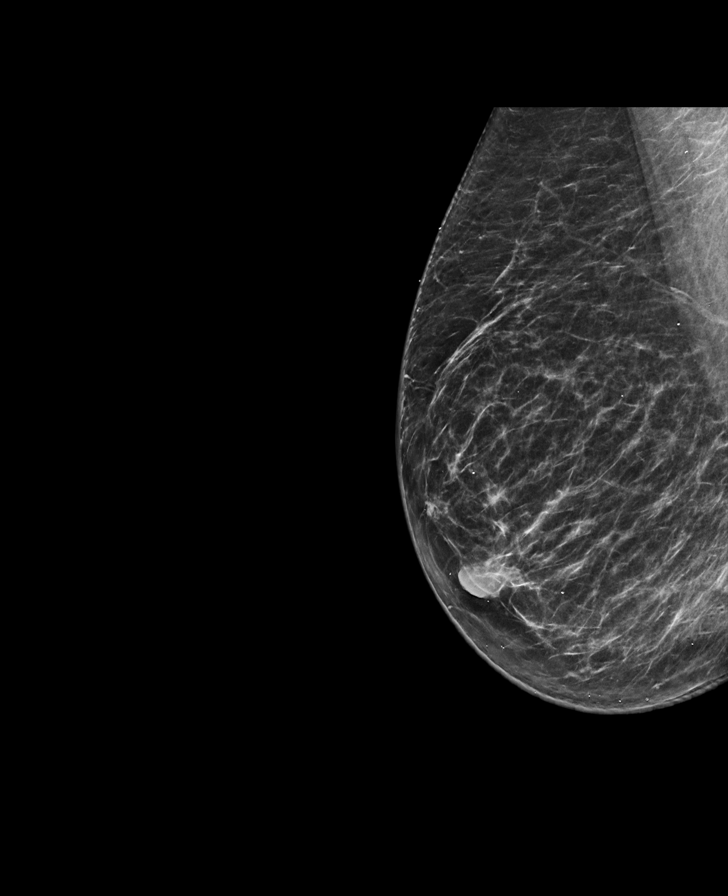

[R CC synth-2D]
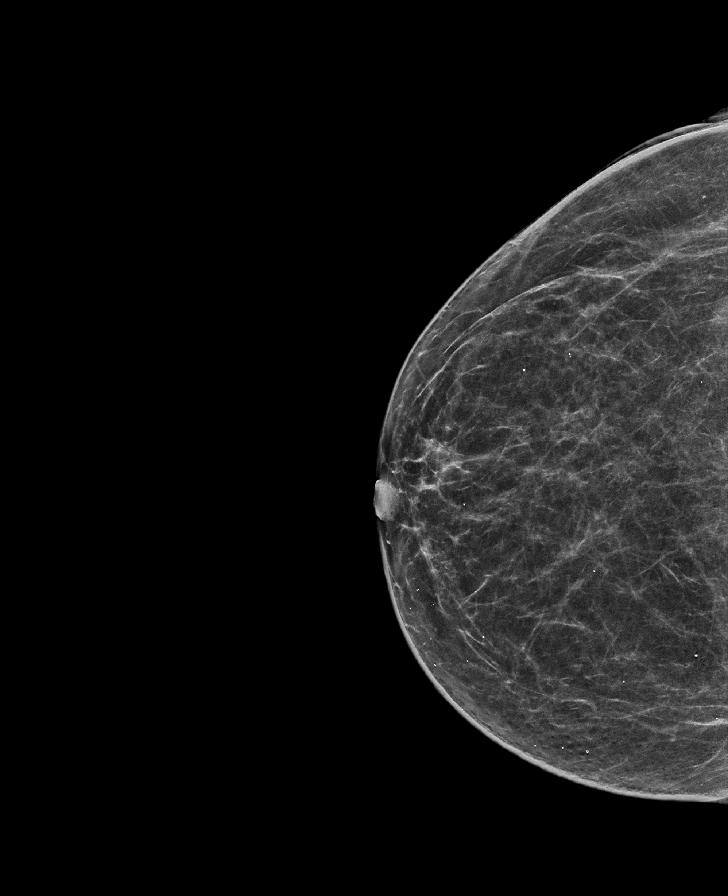

[L MLO tomo · tomo slice 39/77.0]
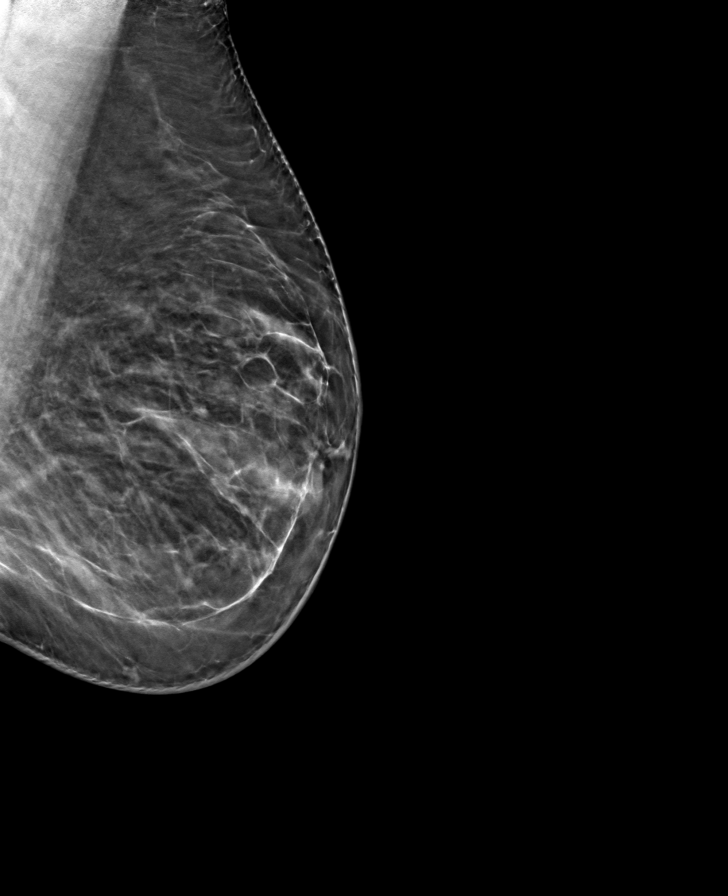

[L CC tomo · tomo slice 37/73.0]
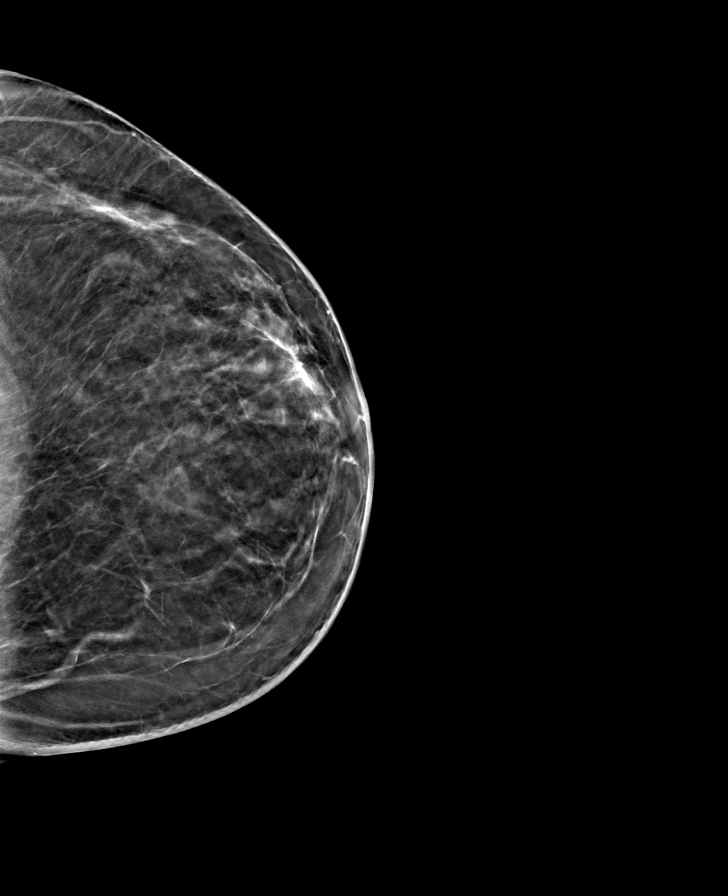

[R MLO tomo · tomo slice 37/74.0]
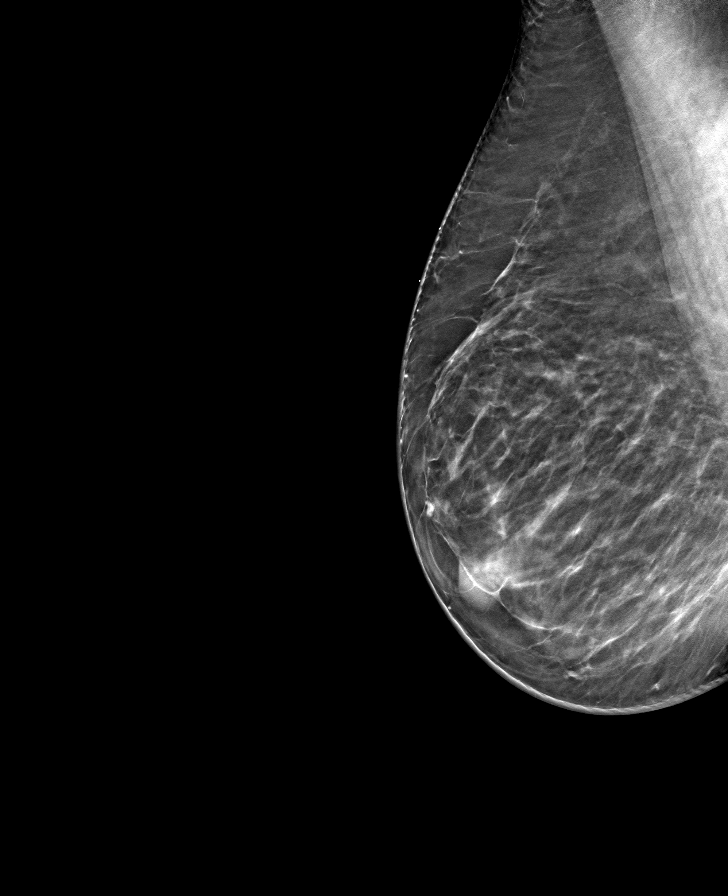

[R CC tomo · tomo slice 35/70.0]
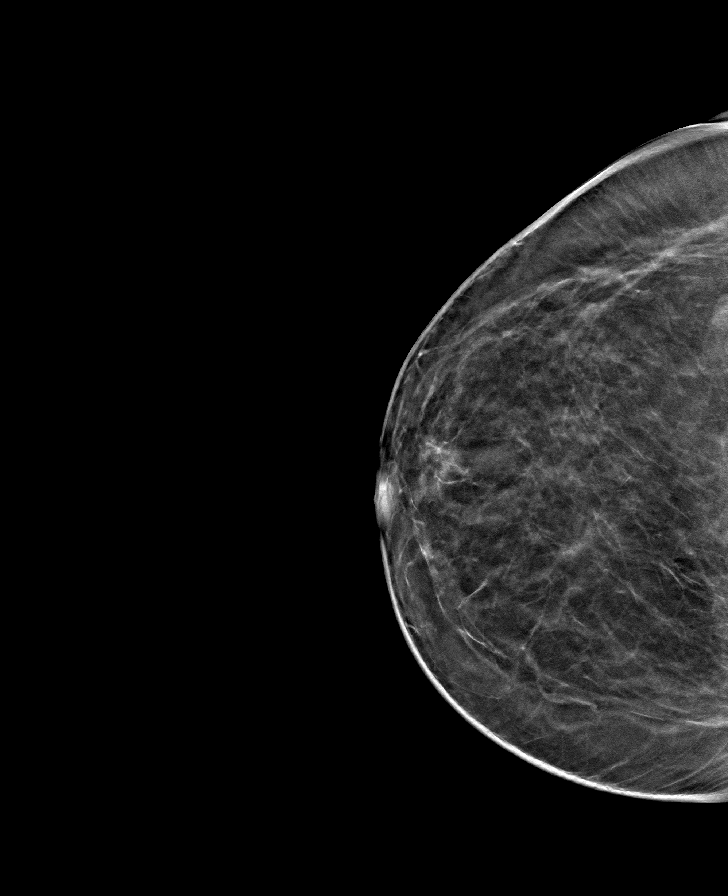

[8 of 24 positions shown; findings below may reference images not displayed]

ACR Breast Density Category b: There are scattered areas of
fibroglandular density.
FINDINGS: There are no findings suspicious for malignancy. Images were
processed with CAD.
IMPRESSION: No mammographic evidence of malignancy. A result letter of this
screening mammogram will be mailed directly to the patient.

RECOMMENDATION:
Screening mammogram in one year. (Code:CN-U-775)

BI-RADS CATEGORY  1: Negative.

## 2020-03-15 DIAGNOSIS — Z20822 Contact with and (suspected) exposure to covid-19: Secondary | ICD-10-CM | POA: Diagnosis not present

## 2020-03-15 DIAGNOSIS — Z03818 Encounter for observation for suspected exposure to other biological agents ruled out: Secondary | ICD-10-CM | POA: Diagnosis not present

## 2020-03-31 DIAGNOSIS — G20 Parkinson's disease: Secondary | ICD-10-CM | POA: Diagnosis not present

## 2020-06-22 DIAGNOSIS — G20 Parkinson's disease: Secondary | ICD-10-CM | POA: Diagnosis not present

## 2020-08-03 DIAGNOSIS — R739 Hyperglycemia, unspecified: Secondary | ICD-10-CM | POA: Diagnosis not present

## 2020-08-03 DIAGNOSIS — Z0000 Encounter for general adult medical examination without abnormal findings: Secondary | ICD-10-CM | POA: Diagnosis not present

## 2020-08-03 DIAGNOSIS — E669 Obesity, unspecified: Secondary | ICD-10-CM | POA: Diagnosis not present

## 2020-08-03 DIAGNOSIS — I10 Essential (primary) hypertension: Secondary | ICD-10-CM | POA: Diagnosis not present

## 2020-08-10 DIAGNOSIS — I10 Essential (primary) hypertension: Secondary | ICD-10-CM | POA: Diagnosis not present

## 2020-08-10 DIAGNOSIS — Z0000 Encounter for general adult medical examination without abnormal findings: Secondary | ICD-10-CM | POA: Diagnosis not present

## 2020-08-10 DIAGNOSIS — R82998 Other abnormal findings in urine: Secondary | ICD-10-CM | POA: Diagnosis not present

## 2020-10-23 DIAGNOSIS — G20 Parkinson's disease: Secondary | ICD-10-CM | POA: Diagnosis not present

## 2020-10-23 DIAGNOSIS — Z79899 Other long term (current) drug therapy: Secondary | ICD-10-CM | POA: Diagnosis not present

## 2020-12-12 DIAGNOSIS — Z23 Encounter for immunization: Secondary | ICD-10-CM | POA: Diagnosis not present

## 2021-02-16 DIAGNOSIS — G20 Parkinson's disease: Secondary | ICD-10-CM | POA: Diagnosis not present

## 2021-06-18 DIAGNOSIS — G20 Parkinson's disease: Secondary | ICD-10-CM | POA: Diagnosis not present

## 2021-06-18 DIAGNOSIS — Z79899 Other long term (current) drug therapy: Secondary | ICD-10-CM | POA: Diagnosis not present

## 2021-10-21 DIAGNOSIS — G20 Parkinson's disease: Secondary | ICD-10-CM | POA: Diagnosis not present

## 2021-11-01 ENCOUNTER — Other Ambulatory Visit: Payer: Self-pay | Admitting: Internal Medicine

## 2021-11-01 DIAGNOSIS — Z1231 Encounter for screening mammogram for malignant neoplasm of breast: Secondary | ICD-10-CM

## 2021-11-18 ENCOUNTER — Ambulatory Visit
Admission: RE | Admit: 2021-11-18 | Discharge: 2021-11-18 | Disposition: A | Discharge status: Home / Self Care | Payer: BC Managed Care – PPO | Source: Ambulatory Visit | Attending: Internal Medicine | Admitting: Internal Medicine

## 2021-11-18 DIAGNOSIS — Z1231 Encounter for screening mammogram for malignant neoplasm of breast: Secondary | ICD-10-CM

## 2021-12-14 DIAGNOSIS — D649 Anemia, unspecified: Secondary | ICD-10-CM | POA: Diagnosis not present

## 2021-12-14 DIAGNOSIS — R739 Hyperglycemia, unspecified: Secondary | ICD-10-CM | POA: Diagnosis not present

## 2021-12-14 DIAGNOSIS — I10 Essential (primary) hypertension: Secondary | ICD-10-CM | POA: Diagnosis not present

## 2021-12-21 DIAGNOSIS — I10 Essential (primary) hypertension: Secondary | ICD-10-CM | POA: Diagnosis not present

## 2021-12-21 DIAGNOSIS — R82998 Other abnormal findings in urine: Secondary | ICD-10-CM | POA: Diagnosis not present

## 2021-12-21 DIAGNOSIS — Z1331 Encounter for screening for depression: Secondary | ICD-10-CM | POA: Diagnosis not present

## 2021-12-21 DIAGNOSIS — Z23 Encounter for immunization: Secondary | ICD-10-CM | POA: Diagnosis not present

## 2021-12-21 DIAGNOSIS — Z0000 Encounter for general adult medical examination without abnormal findings: Secondary | ICD-10-CM | POA: Diagnosis not present

## 2022-02-01 DIAGNOSIS — G20A1 Parkinson's disease without dyskinesia, without mention of fluctuations: Secondary | ICD-10-CM | POA: Diagnosis not present

## 2022-06-03 DIAGNOSIS — G20A2 Parkinson's disease without dyskinesia, with fluctuations: Secondary | ICD-10-CM | POA: Diagnosis not present

## 2022-06-17 DIAGNOSIS — M79672 Pain in left foot: Secondary | ICD-10-CM | POA: Diagnosis not present

## 2022-06-17 DIAGNOSIS — B351 Tinea unguium: Secondary | ICD-10-CM | POA: Diagnosis not present

## 2022-06-17 DIAGNOSIS — R2240 Localized swelling, mass and lump, unspecified lower limb: Secondary | ICD-10-CM | POA: Diagnosis not present

## 2022-06-30 ENCOUNTER — Ambulatory Visit (INDEPENDENT_AMBULATORY_CARE_PROVIDER_SITE_OTHER): Payer: BC Managed Care – PPO

## 2022-06-30 ENCOUNTER — Ambulatory Visit (INDEPENDENT_AMBULATORY_CARE_PROVIDER_SITE_OTHER): Payer: BC Managed Care – PPO | Admitting: Podiatry

## 2022-06-30 DIAGNOSIS — L603 Nail dystrophy: Secondary | ICD-10-CM

## 2022-06-30 DIAGNOSIS — M6740 Ganglion, unspecified site: Secondary | ICD-10-CM

## 2022-06-30 DIAGNOSIS — M7989 Other specified soft tissue disorders: Secondary | ICD-10-CM | POA: Diagnosis not present

## 2022-06-30 DIAGNOSIS — L608 Other nail disorders: Secondary | ICD-10-CM | POA: Diagnosis not present

## 2022-07-01 NOTE — Progress Notes (Signed)
  Subjective:  Patient ID: Alexandra Newman, female    DOB: September 11, 1960,  MRN: [id number] HPI Chief Complaint  Patient presents with   soft tissue mass    Patient came in today for left foot top of the foot, soft tissues mass, started 2 months ago with no pain, 2weeks ago patient started having pain, patient has been using a heating pad, patient also has nail fungus     62 y.o. female presents with the above complaint.   ROS: Denies fever chills nausea vomit muscle aches pains calf pain back pain chest pain shortness of breath.  Past Medical History:  Diagnosis Date   Hypertension    Irregular bleeding    Irregular menses    Obesity    Palpitations    Past Surgical History:  Procedure Laterality Date   appendectomy     taken during above tumor removal   ECTOPIC PREGNANCY SURGERY     TONSILLECTOMY  62 yr old   TUMOR REMOVAL Right    from right ovary    Current Outpatient Medications:    acetaminophen (TYLENOL) 325 MG tablet, Take 650 mg by mouth every 6 (six) hours as needed for pain., Disp: , Rfl:    aspirin 81 MG tablet, Take 81 mg by mouth daily., Disp: , Rfl:    atenolol (TENORMIN) 25 MG tablet, Take 25 mg by mouth daily., Disp: , Rfl:   No Known Allergies Review of Systems Objective:  There were no vitals filed for this visit.  General: Well developed, nourished, in no acute distress, alert and oriented x3   Dermatological: Skin is warm, dry and supple bilateral. Nails x 10 are well maintained, hallux nails however demonstrate thick yellow dystrophic possibly mycotic change.; remaining integument appears unremarkable at this time. There are no open sores, no preulcerative lesions, no rash or signs of infection present.  She has a ganglion cyst to the dorsal aspect of the left foot.  Has decreased considerably with heat application.  Palpable spur beneath the ganglion.  Vascular: Dorsalis Pedis artery and Posterior Tibial artery pedal pulses are 2/4 bilateral with immedate  capillary fill time. Pedal hair growth present. No varicosities and no lower extremity edema present bilateral.   Neruologic: Grossly intact via light touch bilateral. Vibratory intact via tuning fork bilateral. Protective threshold with Semmes Wienstein monofilament intact to all pedal sites bilateral. Patellar and Achilles deep tendon reflexes 2+ bilateral. No Babinski or clonus noted bilateral.   Musculoskeletal: No gross boney pedal deformities bilateral. No pain, crepitus, or limitation noted with foot and ankle range of motion bilateral. Muscular strength 5/5 in all groups tested bilateral.  Gait: Unassisted, Nonantalgic.    Radiographs:  Radiographs taken do not demonstrate any type of soft tissue mass with any calcification.  There is some soft tissue edema throughout the dorsal aspect of the foot there is no significant osseous abnormalities on radiograph.  Assessment & Plan:   Assessment: Ganglion cyst left nail dystrophy hallux  Plan: Samples of the nail and skin were taken today for pathologic evaluation follow-up with her in 4 weeks.  Instructed her to follow-up with [place] should the ganglion recur and not reduced.     Paisli Silfies T. [place], [place]

## 2022-07-12 ENCOUNTER — Telehealth: Payer: Self-pay | Admitting: *Deleted

## 2022-07-12 NOTE — Telephone Encounter (Signed)
-----   Message from Elinor Parkinson, [place] sent at 07/12/2022  7:30 AM EDT ----- NEGATIVE FOR FUNGUS

## 2022-07-22 ENCOUNTER — Other Ambulatory Visit: Payer: Self-pay | Admitting: Podiatry

## 2022-07-22 DIAGNOSIS — M6740 Ganglion, unspecified site: Secondary | ICD-10-CM

## 2022-07-22 DIAGNOSIS — M7989 Other specified soft tissue disorders: Secondary | ICD-10-CM

## 2022-07-22 DIAGNOSIS — L603 Nail dystrophy: Secondary | ICD-10-CM

## 2022-07-28 ENCOUNTER — Ambulatory Visit: Payer: BC Managed Care – PPO | Admitting: Podiatry

## 2022-10-21 DIAGNOSIS — G20A2 Parkinson's disease without dyskinesia, with fluctuations: Secondary | ICD-10-CM | POA: Diagnosis not present

## 2022-12-20 DIAGNOSIS — Z1389 Encounter for screening for other disorder: Secondary | ICD-10-CM | POA: Diagnosis not present

## 2022-12-20 DIAGNOSIS — D649 Anemia, unspecified: Secondary | ICD-10-CM | POA: Diagnosis not present

## 2022-12-20 DIAGNOSIS — I10 Essential (primary) hypertension: Secondary | ICD-10-CM | POA: Diagnosis not present

## 2022-12-20 DIAGNOSIS — R739 Hyperglycemia, unspecified: Secondary | ICD-10-CM | POA: Diagnosis not present

## 2022-12-27 DIAGNOSIS — Z23 Encounter for immunization: Secondary | ICD-10-CM | POA: Diagnosis not present

## 2022-12-27 DIAGNOSIS — G20A1 Parkinson's disease without dyskinesia, without mention of fluctuations: Secondary | ICD-10-CM | POA: Diagnosis not present

## 2022-12-27 DIAGNOSIS — Z0000 Encounter for general adult medical examination without abnormal findings: Secondary | ICD-10-CM | POA: Diagnosis not present

## 2022-12-27 DIAGNOSIS — Z1212 Encounter for screening for malignant neoplasm of rectum: Secondary | ICD-10-CM | POA: Diagnosis not present

## 2022-12-27 DIAGNOSIS — R82998 Other abnormal findings in urine: Secondary | ICD-10-CM | POA: Diagnosis not present

## 2023-02-10 ENCOUNTER — Other Ambulatory Visit: Payer: Self-pay | Admitting: Internal Medicine

## 2023-02-10 DIAGNOSIS — Z1231 Encounter for screening mammogram for malignant neoplasm of breast: Secondary | ICD-10-CM

## 2023-02-21 ENCOUNTER — Ambulatory Visit
Admission: RE | Admit: 2023-02-21 | Discharge: 2023-02-21 | Disposition: A | Discharge status: Home / Self Care | Payer: BC Managed Care – PPO | Source: Ambulatory Visit | Attending: Internal Medicine | Admitting: Internal Medicine

## 2023-02-21 DIAGNOSIS — Z1231 Encounter for screening mammogram for malignant neoplasm of breast: Secondary | ICD-10-CM | POA: Diagnosis not present

## 2023-03-07 DIAGNOSIS — G20A2 Parkinson's disease without dyskinesia, with fluctuations: Secondary | ICD-10-CM | POA: Diagnosis not present

## 2023-03-29 DIAGNOSIS — G20A2 Parkinson's disease without dyskinesia, with fluctuations: Secondary | ICD-10-CM | POA: Diagnosis not present

## 2023-04-05 ENCOUNTER — Ambulatory Visit (INDEPENDENT_AMBULATORY_CARE_PROVIDER_SITE_OTHER): Payer: BC Managed Care – PPO | Admitting: Podiatry

## 2023-04-05 DIAGNOSIS — M7662 Achilles tendinitis, left leg: Secondary | ICD-10-CM | POA: Diagnosis not present

## 2023-04-05 NOTE — Progress Notes (Signed)
  Subjective:  Patient ID: Alexandra Newman, female    DOB: [birth year],  MRN: [id number]  Chief Complaint  Patient presents with   Foot Pain    Left foot pain at the back of the heel pt stated that she feels like its pulling at times    63 y.o. female presents with the above complaint.  Patient presents with left Achilles tendinitis insertional pain painful to touch is progressive and worse worse with ambulation worse with pressure she has not seen anyone else prior to seeing me denies any other acute complaints.  Pain scale 7 out of 10 dull aching nature she feels like there is some pulling as well   Review of Systems: Negative except as noted in the HPI. Denies N/V/F/Ch.  Past Medical History:  Diagnosis Date   Hypertension    Irregular bleeding    Irregular menses    Obesity    Palpitations     Current Outpatient Medications:    acetaminophen (TYLENOL) 325 MG tablet, Take 650 mg by mouth every 6 (six) hours as needed for pain., Disp: , Rfl:    aspirin 81 MG tablet, Take 81 mg by mouth daily., Disp: , Rfl:    atenolol (TENORMIN) 25 MG tablet, Take 25 mg by mouth daily., Disp: , Rfl:   Social History   Tobacco Use  Smoking Status Never  Smokeless Tobacco Never    No Known Allergies Objective:  There were no vitals filed for this visit. There is no height or weight on file to calculate BMI. Constitutional Well developed. Well nourished.  Vascular Dorsalis pedis pulses palpable bilaterally. Posterior tibial pulses palpable bilaterally. Capillary refill normal to all digits.  No cyanosis or clubbing noted. Pedal hair growth normal.  Neurologic Normal speech. Oriented to person, place, and time. Epicritic sensation to light touch grossly present bilaterally.  Dermatologic Nails well groomed and normal in appearance. No open wounds. No skin lesions.  Orthopedic: Pain on palpation left Achilles tendon insertional pain pain with dorsiflexion of the ankle joint no pain with  plantarflexion of the ankle joint.   Radiographs: None Assessment:   1. Achilles tendinitis, left leg    Plan:  Patient was evaluated and treated and all questions answered.  Left Achilles tendinitis insertional pain -All questions and concerns were discussed with the patient in extensive detail given the amount of pain that she is having she will benefit from cam boot immobilization patient agrees with plan to proceed with cam boot immobilization Cam boot was dispensed  No follow-ups on file.

## 2023-04-14 DIAGNOSIS — G20A1 Parkinson's disease without dyskinesia, without mention of fluctuations: Secondary | ICD-10-CM | POA: Diagnosis not present

## 2023-05-03 ENCOUNTER — Ambulatory Visit (INDEPENDENT_AMBULATORY_CARE_PROVIDER_SITE_OTHER): Payer: BC Managed Care – PPO | Admitting: Podiatry

## 2023-05-03 DIAGNOSIS — M7662 Achilles tendinitis, left leg: Secondary | ICD-10-CM

## 2023-05-03 DIAGNOSIS — Q666 Other congenital valgus deformities of feet: Secondary | ICD-10-CM | POA: Diagnosis not present

## 2023-05-03 NOTE — Progress Notes (Signed)
  Subjective:  Patient ID: Alexandra Newman, female    DOB: 08-12-1960,  MRN: [id number]  Chief Complaint  Patient presents with   Foot Pain    63 y.o. female presents with the above complaint.  Patient presents for follow-up of left Achilles tendinitis.  Patient states the cam boot immobilization helped a little but she still has some residual pain would like to discuss  Plan denies any other acute complaints like to get orthotics as well   Review of Systems: Negative except as noted in the HPI. Denies N/V/F/Ch.  Past Medical History:  Diagnosis Date   Hypertension    Irregular bleeding    Irregular menses    Obesity    Palpitations     Current Outpatient Medications:    acetaminophen (TYLENOL) 325 MG tablet, Take 650 mg by mouth every 6 (six) hours as needed for pain., Disp: , Rfl:    aspirin 81 MG tablet, Take 81 mg by mouth daily., Disp: , Rfl:    atenolol (TENORMIN) 25 MG tablet, Take 25 mg by mouth daily., Disp: , Rfl:   Social History   Tobacco Use  Smoking Status Never  Smokeless Tobacco Never    No Known Allergies Objective:  There were no vitals filed for this visit. There is no height or weight on file to calculate BMI. Constitutional Well developed. Well nourished.  Vascular Dorsalis pedis pulses palpable bilaterally. Posterior tibial pulses palpable bilaterally. Capillary refill normal to all digits.  No cyanosis or clubbing noted. Pedal hair growth normal.  Neurologic Normal speech. Oriented to person, place, and time. Epicritic sensation to light touch grossly present bilaterally.  Dermatologic Nails well groomed and normal in appearance. No open wounds. No skin lesions.  Orthopedic: Pain on palpation left Achilles tendon insertional pain pain with dorsiflexion of the ankle joint no pain with plantarflexion of the ankle joint.   Radiographs: None Assessment:   1. Achilles tendinitis, left leg   2. Pes planovalgus     Plan:  Patient was  evaluated and treated and all questions answered.  Left Achilles tendinitis insertional pain -All questions and concerns were discussed with the patient in extensive detail given that she still has some residual pain she would benefit from transition out of cam boot into a Tri-Lock ankle brace Tri-Lock ankle brace was dispensed she will also benefit from steroid injection help decrease inflammatory component associate with pain.  Patient agrees with plan we will proceed with steroid injection -A steroid injection was performed at left Kager's fat pad using 1% plain Lidocaine and 10 mg of Kenalog. This was well tolerated.  Pes planovalgus -I explained to patient the etiology of pes planovalgus and relationship with Planter fasciitis and various treatment options were discussed.  Given patient foot structure in the setting of Planter fasciitis I believe patient will benefit from custom-made orthotics to help control the hindfoot motion support the arch of the foot and take the stress away from plantar fascial.  Patient agrees with the plan like to proceed with orthotics -Patient was casted for orthotics    No follow-ups on file.

## 2023-05-14 NOTE — Progress Notes (Signed)
  Orthotic order placed will schedule for fitting when in

## 2023-05-31 ENCOUNTER — Ambulatory Visit (INDEPENDENT_AMBULATORY_CARE_PROVIDER_SITE_OTHER): Admitting: Podiatry

## 2023-05-31 DIAGNOSIS — M7662 Achilles tendinitis, left leg: Secondary | ICD-10-CM

## 2023-05-31 NOTE — Progress Notes (Signed)
  Subjective:  Patient ID: Alexandra Newman, female    DOB: 31-Jan-1961,  MRN: [id number]  Chief Complaint  Patient presents with   Foot Pain    63 y.o. female presents with the above complaint.  Patient presents for follow-up of left Achilles tendinitis.  Patient states the cam boot immobilization helped a little but she still has some residual pain would like to discuss  Plan denies any other acute complaints like to get orthotics as well   Review of Systems: Negative except as noted in the HPI. Denies N/V/F/Ch.  Past Medical History:  Diagnosis Date   Hypertension    Irregular bleeding    Irregular menses    Obesity    Palpitations     Current Outpatient Medications:    acetaminophen (TYLENOL) 325 MG tablet, Take 650 mg by mouth every 6 (six) hours as needed for pain., Disp: , Rfl:    aspirin 81 MG tablet, Take 81 mg by mouth daily., Disp: , Rfl:    atenolol (TENORMIN) 25 MG tablet, Take 25 mg by mouth daily., Disp: , Rfl:   Social History   Tobacco Use  Smoking Status Never  Smokeless Tobacco Never    No Known Allergies Objective:  There were no vitals filed for this visit. There is no height or weight on file to calculate BMI. Constitutional Well developed. Well nourished.  Vascular Dorsalis pedis pulses palpable bilaterally. Posterior tibial pulses palpable bilaterally. Capillary refill normal to all digits.  No cyanosis or clubbing noted. Pedal hair growth normal.  Neurologic Normal speech. Oriented to person, place, and time. Epicritic sensation to light touch grossly present bilaterally.  Dermatologic Nails well groomed and normal in appearance. No open wounds. No skin lesions.  Orthopedic: No further pain on palpation left Achilles tendon insertional pain and no further pain with dorsiflexion of the ankle joint no pain with plantarflexion of the ankle joint.   Radiographs: None Assessment:   1. Achilles tendinitis, left leg      Plan:  Patient was  evaluated and treated and all questions answered.  Left Achilles tendinitis insertional pain - Clinically healed and doing much better.  I discussed shoe gear modification orthotics management she states that she will continue wearing them.  Orthotics were dispensed  Pes planovalgus -I explained to patient the etiology of pes planovalgus and relationship with Planter fasciitis and various treatment options were discussed.  Given patient foot structure in the setting of Planter fasciitis I believe patient will benefit from custom-made orthotics to help control the hindfoot motion support the arch of the foot and take the stress away from plantar fascial.  Patient agrees with the plan like to proceed with orthotics -Orthotics were dispensed and they are functioning well no acute complaints    No follow-ups on file.

## 2023-09-23 ENCOUNTER — Encounter: Payer: Self-pay | Admitting: Internal Medicine

## 2024-01-26 ENCOUNTER — Telehealth: Payer: Self-pay

## 2024-01-26 NOTE — Telephone Encounter (Signed)
 Attempted to reach patient concerning the need for a recall colon to be scheduled; unable to speak with patient; Left message and number to the office for patient to call back at her convenience;

## 2024-02-14 NOTE — Telephone Encounter (Signed)
 Attempted to reach patient concerning colonoscopy recall; unable to speak with patient;  left message and number to the office for patient to call back and schedule appts;
